# Patient Record
Sex: Female | Born: 1960
Health system: Southern US, Community
[De-identification: ages and names within clinical notes are randomized; demographics above are authoritative.]

## PROBLEM LIST (undated history)

## (undated) DIAGNOSIS — M2550 Pain in unspecified joint: Secondary | ICD-10-CM

## (undated) DIAGNOSIS — R569 Unspecified convulsions: Secondary | ICD-10-CM

## (undated) DIAGNOSIS — R351 Nocturia: Secondary | ICD-10-CM

## (undated) DIAGNOSIS — T7840XA Allergy, unspecified, initial encounter: Secondary | ICD-10-CM

## (undated) DIAGNOSIS — Z8709 Personal history of other diseases of the respiratory system: Secondary | ICD-10-CM

## (undated) DIAGNOSIS — F419 Anxiety disorder, unspecified: Secondary | ICD-10-CM

## (undated) DIAGNOSIS — R531 Weakness: Secondary | ICD-10-CM

## (undated) DIAGNOSIS — R3915 Urgency of urination: Secondary | ICD-10-CM

## (undated) DIAGNOSIS — J302 Other seasonal allergic rhinitis: Secondary | ICD-10-CM

## (undated) DIAGNOSIS — M62830 Muscle spasm of back: Secondary | ICD-10-CM

## (undated) DIAGNOSIS — D649 Anemia, unspecified: Secondary | ICD-10-CM

## (undated) DIAGNOSIS — K219 Gastro-esophageal reflux disease without esophagitis: Secondary | ICD-10-CM

## (undated) DIAGNOSIS — G8929 Other chronic pain: Secondary | ICD-10-CM

## (undated) DIAGNOSIS — G4700 Insomnia, unspecified: Secondary | ICD-10-CM

## (undated) DIAGNOSIS — E785 Hyperlipidemia, unspecified: Secondary | ICD-10-CM

## (undated) DIAGNOSIS — R350 Frequency of micturition: Secondary | ICD-10-CM

## (undated) HISTORY — PX: ABDOMINAL HYSTERECTOMY: SHX81

## (undated) HISTORY — PX: TONSILLECTOMY: SUR1361

## (undated) HISTORY — DX: Allergy, unspecified, initial encounter: T78.40XA

## (undated) HISTORY — DX: Gastro-esophageal reflux disease without esophagitis: K21.9

---

## 1999-12-09 ENCOUNTER — Emergency Department (HOSPITAL_COMMUNITY): Admission: EM | Admit: 1999-12-09 | Discharge: 1999-12-10 | Payer: Self-pay | Admitting: Internal Medicine

## 2004-09-11 ENCOUNTER — Emergency Department (HOSPITAL_COMMUNITY): Admission: EM | Admit: 2004-09-11 | Discharge: 2004-09-12 | Payer: Self-pay | Admitting: Emergency Medicine

## 2004-09-20 ENCOUNTER — Emergency Department (HOSPITAL_COMMUNITY): Admission: EM | Admit: 2004-09-20 | Discharge: 2004-09-20 | Payer: Self-pay | Admitting: Emergency Medicine

## 2005-02-09 ENCOUNTER — Emergency Department (HOSPITAL_COMMUNITY): Admission: EM | Admit: 2005-02-09 | Discharge: 2005-02-09 | Payer: Self-pay | Admitting: Emergency Medicine

## 2005-07-28 ENCOUNTER — Ambulatory Visit: Payer: Self-pay | Admitting: Family Medicine

## 2005-07-29 ENCOUNTER — Ambulatory Visit: Payer: Self-pay | Admitting: *Deleted

## 2005-08-13 ENCOUNTER — Encounter: Payer: Self-pay | Admitting: Cardiology

## 2005-08-13 ENCOUNTER — Ambulatory Visit: Payer: Self-pay | Admitting: Cardiology

## 2005-08-13 ENCOUNTER — Ambulatory Visit (HOSPITAL_COMMUNITY): Admission: RE | Admit: 2005-08-13 | Discharge: 2005-08-13 | Payer: Self-pay | Admitting: Internal Medicine

## 2005-08-26 ENCOUNTER — Ambulatory Visit: Payer: Self-pay | Admitting: Family Medicine

## 2005-09-09 ENCOUNTER — Ambulatory Visit: Payer: Self-pay | Admitting: Family Medicine

## 2006-01-06 ENCOUNTER — Ambulatory Visit: Payer: Self-pay | Admitting: Family Medicine

## 2006-12-16 ENCOUNTER — Encounter (INDEPENDENT_AMBULATORY_CARE_PROVIDER_SITE_OTHER): Payer: Self-pay | Admitting: *Deleted

## 2007-04-02 ENCOUNTER — Encounter: Admission: RE | Admit: 2007-04-02 | Discharge: 2007-04-02 | Payer: Self-pay | Admitting: *Deleted

## 2007-10-19 ENCOUNTER — Ambulatory Visit (HOSPITAL_COMMUNITY): Admission: RE | Admit: 2007-10-19 | Discharge: 2007-10-20 | Payer: Self-pay | Admitting: Obstetrics & Gynecology

## 2007-10-19 ENCOUNTER — Encounter (INDEPENDENT_AMBULATORY_CARE_PROVIDER_SITE_OTHER): Payer: Self-pay | Admitting: Obstetrics & Gynecology

## 2008-04-18 ENCOUNTER — Encounter: Admission: RE | Admit: 2008-04-18 | Discharge: 2008-04-18 | Payer: Self-pay | Admitting: Obstetrics & Gynecology

## 2008-07-20 ENCOUNTER — Encounter: Admission: RE | Admit: 2008-07-20 | Discharge: 2008-07-20 | Payer: Self-pay | Admitting: Family Medicine

## 2010-04-30 ENCOUNTER — Other Ambulatory Visit: Payer: Self-pay | Admitting: Family Medicine

## 2010-04-30 DIAGNOSIS — Z1239 Encounter for other screening for malignant neoplasm of breast: Secondary | ICD-10-CM

## 2010-04-30 DIAGNOSIS — Z90710 Acquired absence of both cervix and uterus: Secondary | ICD-10-CM

## 2010-07-22 ENCOUNTER — Inpatient Hospital Stay: Admission: RE | Admit: 2010-07-22 | Payer: Self-pay | Source: Ambulatory Visit

## 2010-07-22 ENCOUNTER — Ambulatory Visit: Payer: Self-pay

## 2010-08-13 NOTE — Op Note (Signed)
NAME:  Gina Velez, Gina Velez               ACCOUNT NO.:  [id number]   MEDICAL RECORD NO.:  [id number]          PATIENT TYPE:  AMB   LOCATION:  DAY                          FACILITY:  [facility]   PHYSICIAN:  Genia Del, M.D.DATE OF BIRTH:  [birth year]   DATE OF PROCEDURE:  10/19/2007  DATE OF DISCHARGE:                               OPERATIVE REPORT   PREOPERATIVE DIAGNOSIS:  Large symptomatic uterine myomas with  menorrhagia and secondary anemia.   POSTOPERATIVE DIAGNOSIS:  Large symptomatic uterine myomas with  menorrhagia and secondary anemia.   PROCEDURE:  Total laparoscopic hysterectomy assisted with Engineer, building services  robot.   SURGEON:  Dr. Genia Del.   ASSISTANT:  Dr. Meredeth Ide.   PROCEDURE:  Under general anesthesia with endotracheal intubation, the  patient is in decubitus position.  She is prepped with Betadine on the  abdominal, suprapubic, vulvar and vaginal areas and draped as usual.  A  Foley is put in place in the bladder.  We then do a vaginal exam  revealing an anteverted uterus just below the umbilicus, mobile and  irregular, it measures about 16 cm.  We then introduce a weighted  speculum.  We grasp the cervix with a tenaculum.  We proceed with  dilatation of the cervix up to Hegar dilator #27.  We then used a #10  Rumi with a medium-sized KOH ring.  This is inserted easily.  The  balloon is inflated.  We remove the weighted speculum.  We go to  abdominal time.  We measure 10 cm above the fundus for the  supraumbilical incision.  We infiltrate the thinnest tissue with  Marcaine 25% plain 10 mL at that level.  We then make an incision over  1.5 cm.  We grasp the aponeurosis with Kochers, open the aponeurosis  with Mayo scissors and open the parietal peritoneum bluntly with a  finger.  We make a pursestring stitch of Vicryl 0 at that level.  We  then insert the [facility], create a pneumoperitoneum with CO2, attach the  suture on either side of the [place] and insert  the camera at that level.  The abdominopelvic cavities are normal except for this very large  irregular uterus.  It is mobile and we have enough base on each side to  proceed with robot.  We therefore mark all other ports.  We infiltrate  the subcutaneous tissue with Marcaine 25% plain, a total of 9 mL.  We  make small incision with the scalpel in a semicircular configuration, 8  mm for the robotic ports and 10 mm for the assistant port on the right.  We then insert the trocars under direct vision.  We then placed the  patient in extreme Trendelenburg and side docked the robot on the left  side of the patient.  We then insert the instruments, the Endo shear  scissors in the right robotic arm, the fenestrated bipolar in the left  robotic arm and the Cobra clamp in the third robotic arm on the left.  We start at the console.  We cauterize and section the left round  ligament.  We cauterize and section the left tube and the left utero-  ovarian ligament.  We visualize the left ureter which is very deep in  normal anatomic position.  We then follow over the left lateral side of  the uterus at the broad ligament.  We cauterize and section.  We open  the anterior visceral peritoneum with the Endo shear scissors up to the  midline on that side.  We then go to the right side and proceed exactly  the same way.  We recline the bladder downward pass the KOH ring.  We  then cauterize and section the right uterine artery just above the KOH  ring.  We proceed the same way on the left side.  We then further  recline the bladder and proceed with anterior colpotomy with the point  of the Endo shear scissors.  We then do the colpotomy posteriorly just  above the uterosacral ligaments and we complete the circular opening at  the colpotomy on the left side and then on the right side.  The uterus  is completely detached the KOH ring and Rumi are therefore removed.  The  occluder is put in place in the vagina.   The uterus with cervix is put  in the left gutter during the closure of the vaginal vault.  We change  instruments to the cutting needle driver in the right arm, the regular  needle driver in the left arm and the fenestrated bipolar in the third  robotic arm.  We used Vicryl 0 with a Laparotie at the end and close the  vaginal vault across the right angle to the middle.  We proceed the same  way closing the vaginal vault from the left angle to the middle.  The  vaginal vault is well closed.  Hemostasis is adequate at all levels  including at the pedicles of the adnexa.  We therefore remove the  robotic instruments.  We undock the robot and we go to laparoscopy time.  We insert the morcellator in the assistant port under direct vision.  We  then proceed with morcellation of the uterus with cervix.  The specimen  is weighed.  It is a 682 grams.  The abdominopelvic cavities are  irrigated and suctioned.  All pieces of myomas and uterus were well and  carefully removed.  Hemostasis is adequate at all levels.  The  instruments are therefore removed.  The trocars are removed under direct  vision.  The CO2 is evacuated as completely as possible.  We close the  right assistant port with a Vicryl 0 at the aponeurosis.  We attached  the pursestring stitch at the supraumbilical incision.  We then close  the assistant port incision and the umbilical incision with Vicryl 4-0  at the skin in a subcuticular stitch.  We then apply Dermabond at all  incisions.  Hemostasis is adequate at all levels.  The occluder is  removed from the vagina.  Hemostasis is adequate at that level as well.   ESTIMATED BLOOD LOSS:  100 mL.   The count of instruments and sponges was complete.   The patient was brought to recovery room in good stable status.      Genia Del, M.D.  Electronically Signed     ML/MEDQ  D:  10/19/2007  T:  10/19/2007  Job:  [id number]

## 2010-12-27 LAB — URINALYSIS, ROUTINE W REFLEX MICROSCOPIC
Bilirubin Urine: NEGATIVE
Ketones, ur: NEGATIVE
Nitrite: NEGATIVE
Protein, ur: NEGATIVE
Urobilinogen, UA: 0.2
pH: 6

## 2010-12-27 LAB — CBC
HCT: 28.1 — ABNORMAL LOW
Hemoglobin: 9.1 — ABNORMAL LOW
MCHC: 32.2
MCHC: 32.9
MCV: 81.9
MCV: 82.2
Platelets: 342
RBC: 3.43 — ABNORMAL LOW
RBC: 3.79 — ABNORMAL LOW
RDW: 15.9 — ABNORMAL HIGH
WBC: 4.3

## 2010-12-27 LAB — PREGNANCY, URINE: Preg Test, Ur: NEGATIVE

## 2010-12-27 LAB — TYPE AND SCREEN

## 2012-03-27 ENCOUNTER — Emergency Department (HOSPITAL_COMMUNITY)
Admission: EM | Admit: 2012-03-27 | Discharge: 2012-03-27 | Disposition: A | Discharge status: Home / Self Care | Payer: No Typology Code available for payment source | Attending: Emergency Medicine | Admitting: Emergency Medicine

## 2012-03-27 ENCOUNTER — Emergency Department (HOSPITAL_COMMUNITY): Payer: No Typology Code available for payment source

## 2012-03-27 ENCOUNTER — Encounter (HOSPITAL_COMMUNITY): Payer: Self-pay | Admitting: Emergency Medicine

## 2012-03-27 DIAGNOSIS — Y92410 Unspecified street and highway as the place of occurrence of the external cause: Secondary | ICD-10-CM | POA: Insufficient documentation

## 2012-03-27 DIAGNOSIS — S161XXA Strain of muscle, fascia and tendon at neck level, initial encounter: Secondary | ICD-10-CM

## 2012-03-27 DIAGNOSIS — Y9389 Activity, other specified: Secondary | ICD-10-CM | POA: Insufficient documentation

## 2012-03-27 DIAGNOSIS — S20219A Contusion of unspecified front wall of thorax, initial encounter: Secondary | ICD-10-CM | POA: Insufficient documentation

## 2012-03-27 DIAGNOSIS — S139XXA Sprain of joints and ligaments of unspecified parts of neck, initial encounter: Secondary | ICD-10-CM | POA: Insufficient documentation

## 2012-03-27 MED ORDER — OXYCODONE-ACETAMINOPHEN 5-325 MG PO TABS: 1.0000 | ORAL_TABLET | Freq: Four times a day (QID) | ORAL | Status: DC | PRN

## 2012-03-27 MED ORDER — IBUPROFEN 600 MG PO TABS: 600.0000 mg | ORAL_TABLET | Freq: Four times a day (QID) | ORAL | Status: DC | PRN

## 2012-03-27 MED ORDER — DIAZEPAM 5 MG PO TABS: 5.0000 mg | ORAL_TABLET | Freq: Four times a day (QID) | ORAL | Status: DC | PRN

## 2012-03-27 NOTE — ED Notes (Signed)
Pt ambulatory leaving ED with family; pt given d/c teaching and prescriptions; pt has no further questions upon d/c. Pt does not appear to be in any acute distress upon d/c.

## 2012-03-27 NOTE — ED Notes (Signed)
Pt denies nausea; pt denies blurred vision; pt denies numbness and tingling; pt alert and oriented.
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
## 2012-03-27 NOTE — ED Notes (Signed)
Pt returned from CT °
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
## 2012-09-15 ENCOUNTER — Other Ambulatory Visit: Payer: Self-pay

## 2012-09-15 DIAGNOSIS — Z1231 Encounter for screening mammogram for malignant neoplasm of breast: Secondary | ICD-10-CM

## 2012-09-24 ENCOUNTER — Ambulatory Visit
Admission: RE | Admit: 2012-09-24 | Discharge: 2012-09-24 | Disposition: A | Discharge status: Home / Self Care | Payer: Private Health Insurance - Indemnity | Source: Ambulatory Visit

## 2012-09-24 DIAGNOSIS — Z1231 Encounter for screening mammogram for malignant neoplasm of breast: Secondary | ICD-10-CM

## 2013-04-29 ENCOUNTER — Other Ambulatory Visit: Payer: Self-pay | Admitting: Orthopedic Surgery

## 2013-04-29 ENCOUNTER — Encounter (HOSPITAL_COMMUNITY): Payer: Self-pay | Admitting: Pharmacy Technician

## 2013-05-05 ENCOUNTER — Encounter (HOSPITAL_COMMUNITY)
Admission: RE | Admit: 2013-05-05 | Discharge: 2013-05-05 | Disposition: A | Discharge status: Home / Self Care | Payer: Managed Care, Other (non HMO) | Source: Ambulatory Visit | Attending: Orthopedic Surgery | Admitting: Orthopedic Surgery

## 2013-05-05 ENCOUNTER — Encounter (HOSPITAL_COMMUNITY): Payer: Self-pay

## 2013-05-05 ENCOUNTER — Ambulatory Visit (HOSPITAL_COMMUNITY)
Admission: RE | Admit: 2013-05-05 | Discharge: 2013-05-05 | Disposition: A | Discharge status: Home / Self Care | Payer: Managed Care, Other (non HMO) | Source: Ambulatory Visit | Attending: Orthopedic Surgery | Admitting: Orthopedic Surgery

## 2013-05-05 DIAGNOSIS — Z01810 Encounter for preprocedural cardiovascular examination: Secondary | ICD-10-CM | POA: Insufficient documentation

## 2013-05-05 DIAGNOSIS — M533 Sacrococcygeal disorders, not elsewhere classified: Secondary | ICD-10-CM | POA: Insufficient documentation

## 2013-05-05 DIAGNOSIS — E785 Hyperlipidemia, unspecified: Secondary | ICD-10-CM | POA: Insufficient documentation

## 2013-05-05 DIAGNOSIS — R9431 Abnormal electrocardiogram [ECG] [EKG]: Secondary | ICD-10-CM | POA: Insufficient documentation

## 2013-05-05 DIAGNOSIS — D649 Anemia, unspecified: Secondary | ICD-10-CM | POA: Insufficient documentation

## 2013-05-05 DIAGNOSIS — Z90710 Acquired absence of both cervix and uterus: Secondary | ICD-10-CM | POA: Insufficient documentation

## 2013-05-05 DIAGNOSIS — Z01812 Encounter for preprocedural laboratory examination: Secondary | ICD-10-CM | POA: Insufficient documentation

## 2013-05-05 DIAGNOSIS — Z87891 Personal history of nicotine dependence: Secondary | ICD-10-CM | POA: Insufficient documentation

## 2013-05-05 HISTORY — DX: Hyperlipidemia, unspecified: E78.5

## 2013-05-05 HISTORY — DX: Other seasonal allergic rhinitis: J30.2

## 2013-05-05 HISTORY — DX: Anemia, unspecified: D64.9

## 2013-05-05 HISTORY — DX: Unspecified convulsions: R56.9

## 2013-05-05 HISTORY — DX: Other chronic pain: G89.29

## 2013-05-05 HISTORY — DX: Personal history of other diseases of the respiratory system: Z87.09

## 2013-05-05 HISTORY — DX: Urgency of urination: R39.15

## 2013-05-05 HISTORY — DX: Muscle spasm of back: M62.830

## 2013-05-05 HISTORY — DX: Pain in unspecified joint: M25.50

## 2013-05-05 HISTORY — DX: Nocturia: R35.1

## 2013-05-05 HISTORY — DX: Weakness: R53.1

## 2013-05-05 HISTORY — DX: Frequency of micturition: R35.0

## 2013-05-05 HISTORY — DX: Insomnia, unspecified: G47.00

## 2013-05-05 HISTORY — DX: Anxiety disorder, unspecified: F41.9

## 2013-05-05 LAB — CBC WITH DIFFERENTIAL/PLATELET
Basophils Absolute: 0 10*3/uL (ref 0.0–0.1)
Basophils Relative: 0 % (ref 0–1)
Eosinophils Absolute: 0.1 10*3/uL (ref 0.0–0.7)
Eosinophils Relative: 1 % (ref 0–5)
HEMATOCRIT: 37.8 % (ref 36.0–46.0)
HEMOGLOBIN: 12.7 g/dL (ref 12.0–15.0)
LYMPHS ABS: 2.5 10*3/uL (ref 0.7–4.0)
Lymphocytes Relative: 41 % (ref 12–46)
MCH: 29.3 pg (ref 26.0–34.0)
MCHC: 33.6 g/dL (ref 30.0–36.0)
MCV: 87.3 fL (ref 78.0–100.0)
MONO ABS: 0.4 10*3/uL (ref 0.1–1.0)
Monocytes Relative: 7 % (ref 3–12)
NEUTROS ABS: 3.1 10*3/uL (ref 1.7–7.7)
Neutrophils Relative %: 51 % (ref 43–77)
Platelets: 268 10*3/uL (ref 150–400)
RBC: 4.33 MIL/uL (ref 3.87–5.11)
RDW: 13.8 % (ref 11.5–15.5)
WBC: 6.1 10*3/uL (ref 4.0–10.5)

## 2013-05-05 LAB — COMPREHENSIVE METABOLIC PANEL
ALT: 19 U/L (ref 0–35)
AST: 18 U/L (ref 0–37)
Albumin: 4.2 g/dL (ref 3.5–5.2)
Alkaline Phosphatase: 72 U/L (ref 39–117)
BUN: 5 mg/dL — AB (ref 6–23)
CALCIUM: 9.3 mg/dL (ref 8.4–10.5)
CHLORIDE: 98 meq/L (ref 96–112)
CO2: 27 mEq/L (ref 19–32)
CREATININE: 0.7 mg/dL (ref 0.50–1.10)
GFR calc non Af Amer: >90 mL/min (ref >90)
GLUCOSE: 103 mg/dL — AB (ref 70–99)
Potassium: 4 mEq/L (ref 3.7–5.3)
Sodium: 139 mEq/L (ref 137–147)
Total Bilirubin: 0.3 mg/dL (ref 0.3–1.2)
Total Protein: 7.8 g/dL (ref 6.0–8.3)

## 2013-05-05 LAB — TYPE AND SCREEN
ABO/RH(D): A POS
Antibody Screen: NEGATIVE

## 2013-05-05 LAB — URINALYSIS, ROUTINE W REFLEX MICROSCOPIC
Bilirubin Urine: NEGATIVE
GLUCOSE, UA: NEGATIVE mg/dL
Hgb urine dipstick: NEGATIVE
KETONES UR: NEGATIVE mg/dL
Leukocytes, UA: NEGATIVE
Nitrite: NEGATIVE
PROTEIN: NEGATIVE mg/dL
Specific Gravity, Urine: 1.01 (ref 1.005–1.030)
Urobilinogen, UA: 0.2 mg/dL (ref 0.0–1.0)
pH: 7 (ref 5.0–8.0)

## 2013-05-05 LAB — PROTIME-INR
INR: 0.96 (ref 0.00–1.49)
Prothrombin Time: 12.6 seconds (ref 11.6–15.2)

## 2013-05-05 LAB — SURGICAL PCR SCREEN
MRSA, PCR: NEGATIVE
Staphylococcus aureus: NEGATIVE

## 2013-05-05 LAB — ABO/RH: ABO/RH(D): A POS

## 2013-05-05 LAB — APTT: aPTT: 29 seconds (ref 24–37)

## 2013-05-05 MED ORDER — POVIDONE-IODINE 7.5 % EX SOLN: Freq: Once | CUTANEOUS | Status: DC

## 2013-05-05 NOTE — Pre-Procedure Instructions (Signed)
Gina SalinesCynthia K Velez  05/05/2013   Your procedure is scheduled on:  Wed, Feb 11 @ 2:50 PM  [facility]  at 10:00 AM per Dr.Dumonski  Call this number if you have problems the morning of surgery: [phone]   Remember:   Do not eat food or drink liquids after midnight.   Take these medicines the morning of surgery with A SIP OF WATER: Lexapro(Escitalopram), and Pain Pill(if needed)               No Goody's,BC's,Aspirin,Aleve,Ibuprofen,Fish Oil,or any Herbal Medications   Do not wear jewelry, make-up or nail polish.  Do not wear lotions, powders, or perfumes. You may wear deodorant.  Do not shave 48 hours prior to surgery. Men may shave face and neck.  Do not bring valuables to the hospital.  [facility] is not responsible                  for any belongings or valuables.               Contacts, dentures or bridgework may not be worn into surgery.  Leave suitcase in the car. After surgery it may be brought to your room.  For patients admitted to the hospital, discharge time is determined by your                treatment team.               Patients discharged the day of surgery will not be allowed to drive  home.    Special Instructions:  [facility] - Preparing for Surgery  Before surgery, you can play an important role.  Because skin is not sterile, your skin needs to be as free of germs as possible.  You can reduce the number of germs on you skin by washing with CHG (chlorahexidine gluconate) soap before surgery.  CHG is an antiseptic cleaner which kills germs and bonds with the skin to continue killing germs even after washing.  Please DO NOT use if you have an allergy to CHG or antibacterial soaps.  If your skin becomes reddened/irritated stop using the CHG and inform your nurse when you arrive at Short Stay.  Do not shave (including legs and underarms) for at least 48 hours prior to the first CHG shower.  You may shave your face.  Please follow these  instructions carefully:   1.  Shower with CHG Soap the night before surgery and the                                morning of Surgery.  2.  If you choose to wash your hair, wash your hair first as usual with your       normal shampoo.  3.  After you shampoo, rinse your hair and body thoroughly to remove the                      Shampoo.  4.  Use CHG as you would any other liquid soap.  You can apply chg directly       to the skin and wash gently with scrungie or a clean washcloth.  5.  Apply the CHG Soap to your body ONLY FROM THE NECK DOWN.        Do not use on open wounds or open sores.  Avoid contact with your eyes,  ears, mouth and genitals (private parts).  Wash genitals (private parts)       with your normal soap.  6.  Wash thoroughly, paying special attention to the area where your surgery        will be performed.  7.  Thoroughly rinse your body with warm water from the neck down.  8.  DO NOT shower/wash with your normal soap after using and rinsing off       the CHG Soap.  9.  Pat yourself dry with a clean towel.            10.  Wear clean pajamas.            11.  Place clean sheets on your bed the night of your first shower and do not        sleep with pets.  Day of Surgery  Do not apply any lotions/deoderants the morning of surgery.  Please wear clean clothes to the hospital/surgery center.     Please read over the following fact sheets that you were given: Pain Booklet, Coughing and Deep Breathing, Blood Transfusion Information, MRSA Information and Surgical Site Infection Prevention

## 2013-05-05 NOTE — Progress Notes (Signed)
Pt doesn't have a cardiologist  Echo report in epic from 2007  Denies ever having a stress test or a heart cath  Medical md is Dr. Maryelizabeth RowanElizabeth Dewey  Denies EKG or CXR in past yr

## 2013-05-06 NOTE — Progress Notes (Signed)
Anesthesia Chart Review:  Patient is a 53 year old female scheduled for right SI joint fusion on 05/11/13 by Dr. Yevette Edwardsumonski.  History includes former smoker, HLD, anemia, anxiety, insomnia, tonsillectomy, hysterectomy, seizure related to pregnancy 30+ years ago. PCP is Dr. Maryelizabeth RowanElizabeth Dewey.  EKG on 05/05/13 showed NSR, non-specific ST/T wave changes. Echo in 2007 showed excellent LV function and no significant valvular abnormalities.  Preoperative CXR and labs noted.    Anticipate that she can proceed as planned.  Gina Ochsllison Rana Hochstein, PA-C [facility]/Anesthesiology Phone [phone] 05/06/2013 3:22 PM

## 2013-05-10 MED ORDER — CEFAZOLIN SODIUM-DEXTROSE 2-3 GM-% IV SOLR: 2.0000 g | INTRAVENOUS | Status: DC

## 2013-05-11 ENCOUNTER — Encounter (HOSPITAL_COMMUNITY): Payer: Self-pay | Admitting: *Deleted

## 2013-05-11 ENCOUNTER — Ambulatory Visit (HOSPITAL_COMMUNITY): Payer: Managed Care, Other (non HMO)

## 2013-05-11 ENCOUNTER — Ambulatory Visit (HOSPITAL_COMMUNITY)
Admission: RE | Admit: 2013-05-11 | Discharge: 2013-05-11 | Disposition: A | Discharge status: Home / Self Care | Payer: Managed Care, Other (non HMO) | Source: Ambulatory Visit | Attending: Orthopedic Surgery | Admitting: Orthopedic Surgery

## 2013-05-11 ENCOUNTER — Encounter (HOSPITAL_COMMUNITY)
Admission: RE | Disposition: A | Discharge status: Home / Self Care | Payer: Self-pay | Source: Ambulatory Visit | Attending: Orthopedic Surgery

## 2013-05-11 ENCOUNTER — Encounter (HOSPITAL_COMMUNITY): Payer: Managed Care, Other (non HMO) | Admitting: Vascular Surgery

## 2013-05-11 ENCOUNTER — Ambulatory Visit (HOSPITAL_COMMUNITY): Payer: Managed Care, Other (non HMO) | Admitting: Anesthesiology

## 2013-05-11 DIAGNOSIS — J449 Chronic obstructive pulmonary disease, unspecified: Secondary | ICD-10-CM | POA: Diagnosis not present

## 2013-05-11 DIAGNOSIS — R3915 Urgency of urination: Secondary | ICD-10-CM | POA: Diagnosis not present

## 2013-05-11 DIAGNOSIS — R5383 Other fatigue: Secondary | ICD-10-CM | POA: Diagnosis not present

## 2013-05-11 DIAGNOSIS — N3944 Nocturnal enuresis: Secondary | ICD-10-CM | POA: Diagnosis not present

## 2013-05-11 DIAGNOSIS — R350 Frequency of micturition: Secondary | ICD-10-CM | POA: Insufficient documentation

## 2013-05-11 DIAGNOSIS — J4489 Other specified chronic obstructive pulmonary disease: Secondary | ICD-10-CM | POA: Insufficient documentation

## 2013-05-11 DIAGNOSIS — G4700 Insomnia, unspecified: Secondary | ICD-10-CM | POA: Diagnosis not present

## 2013-05-11 DIAGNOSIS — R5381 Other malaise: Secondary | ICD-10-CM | POA: Diagnosis not present

## 2013-05-11 DIAGNOSIS — Z87891 Personal history of nicotine dependence: Secondary | ICD-10-CM | POA: Diagnosis not present

## 2013-05-11 DIAGNOSIS — M533 Sacrococcygeal disorders, not elsewhere classified: Secondary | ICD-10-CM

## 2013-05-11 DIAGNOSIS — F411 Generalized anxiety disorder: Secondary | ICD-10-CM | POA: Diagnosis not present

## 2013-05-11 DIAGNOSIS — E785 Hyperlipidemia, unspecified: Secondary | ICD-10-CM | POA: Diagnosis not present

## 2013-05-11 DIAGNOSIS — G8929 Other chronic pain: Secondary | ICD-10-CM | POA: Diagnosis not present

## 2013-05-11 HISTORY — PX: SACROILIAC JOINT FUSION: SHX6088

## 2013-05-11 SURGERY — SACROILIAC JOINT FUSION
Anesthesia: General | Site: Spine Lumbar | Laterality: Right

## 2013-05-11 MED ORDER — MIDAZOLAM HCL 5 MG/5ML IJ SOLN
INTRAMUSCULAR | Status: DC | PRN
  Administered 2013-05-11: 2 mg via INTRAVENOUS

## 2013-05-11 MED ORDER — ARTIFICIAL TEARS OP OINT
TOPICAL_OINTMENT | OPHTHALMIC | Status: AC
  Filled 2013-05-11: qty 3.5

## 2013-05-11 MED ORDER — FENTANYL CITRATE 0.05 MG/ML IJ SOLN
INTRAMUSCULAR | Status: AC
  Filled 2013-05-11: qty 5

## 2013-05-11 MED ORDER — MIDAZOLAM HCL 2 MG/2ML IJ SOLN: 0.5000 mg | Freq: Once | INTRAMUSCULAR | Status: DC | PRN

## 2013-05-11 MED ORDER — ARTIFICIAL TEARS OP OINT
TOPICAL_OINTMENT | OPHTHALMIC | Status: DC | PRN
  Administered 2013-05-11: 1 via OPHTHALMIC

## 2013-05-11 MED ORDER — ONDANSETRON HCL 4 MG/2ML IJ SOLN
INTRAMUSCULAR | Status: DC | PRN
  Administered 2013-05-11: 4 mg via INTRAVENOUS

## 2013-05-11 MED ORDER — HYDROMORPHONE HCL PF 1 MG/ML IJ SOLN
INTRAMUSCULAR | Status: AC
  Administered 2013-05-11: 0.5 mg via INTRAVENOUS
  Filled 2013-05-11: qty 1

## 2013-05-11 MED ORDER — OXYCODONE HCL 5 MG/5ML PO SOLN: 5.0000 mg | Freq: Once | ORAL | Status: DC | PRN

## 2013-05-11 MED ORDER — EPHEDRINE SULFATE 50 MG/ML IJ SOLN
INTRAMUSCULAR | Status: AC
  Filled 2013-05-11: qty 1

## 2013-05-11 MED ORDER — SODIUM CHLORIDE 0.9 % IJ SOLN
INTRAMUSCULAR | Status: AC
  Filled 2013-05-11: qty 20

## 2013-05-11 MED ORDER — EPHEDRINE SULFATE 50 MG/ML IJ SOLN
INTRAMUSCULAR | Status: DC | PRN
  Administered 2013-05-11: 7.5 mg via INTRAVENOUS
  Administered 2013-05-11 (×2): 5 mg via INTRAVENOUS

## 2013-05-11 MED ORDER — HYDROMORPHONE HCL PF 1 MG/ML IJ SOLN
0.2500 mg | INTRAMUSCULAR | Status: DC | PRN
  Administered 2013-05-11 (×4): 0.5 mg via INTRAVENOUS

## 2013-05-11 MED ORDER — LACTATED RINGERS IV SOLN
INTRAVENOUS | Status: DC
  Administered 2013-05-11: 11:00:00 via INTRAVENOUS

## 2013-05-11 MED ORDER — PROPOFOL 10 MG/ML IV BOLUS
INTRAVENOUS | Status: AC
  Filled 2013-05-11: qty 20

## 2013-05-11 MED ORDER — ROCURONIUM BROMIDE 100 MG/10ML IV SOLN
INTRAVENOUS | Status: DC | PRN
  Administered 2013-05-11: 50 mg via INTRAVENOUS

## 2013-05-11 MED ORDER — GLYCOPYRROLATE 0.2 MG/ML IJ SOLN
INTRAMUSCULAR | Status: AC
  Filled 2013-05-11: qty 2

## 2013-05-11 MED ORDER — PROPOFOL 10 MG/ML IV BOLUS
INTRAVENOUS | Status: DC | PRN
  Administered 2013-05-11: 130 mg via INTRAVENOUS

## 2013-05-11 MED ORDER — HYDROMORPHONE HCL PF 1 MG/ML IJ SOLN
0.5000 mg | INTRAMUSCULAR | Status: AC | PRN
  Administered 2013-05-11 (×2): 0.5 mg via INTRAVENOUS

## 2013-05-11 MED ORDER — 0.9 % SODIUM CHLORIDE (POUR BTL) OPTIME
TOPICAL | Status: DC | PRN
  Administered 2013-05-11: 1000 mL

## 2013-05-11 MED ORDER — ROCURONIUM BROMIDE 50 MG/5ML IV SOLN
INTRAVENOUS | Status: AC
  Filled 2013-05-11: qty 1

## 2013-05-11 MED ORDER — GLYCOPYRROLATE 0.2 MG/ML IJ SOLN
INTRAMUSCULAR | Status: AC
  Filled 2013-05-11: qty 3

## 2013-05-11 MED ORDER — GLYCOPYRROLATE 0.2 MG/ML IJ SOLN
INTRAMUSCULAR | Status: DC | PRN
  Administered 2013-05-11: 0.4 mg via INTRAVENOUS

## 2013-05-11 MED ORDER — BUPIVACAINE-EPINEPHRINE PF 0.25-1:200000 % IJ SOLN
INTRAMUSCULAR | Status: DC | PRN
  Administered 2013-05-11: 3 mL via PERINEURAL
  Administered 2013-05-11: 10 mL via PERINEURAL

## 2013-05-11 MED ORDER — NEOSTIGMINE METHYLSULFATE 1 MG/ML IJ SOLN
INTRAMUSCULAR | Status: AC
  Filled 2013-05-11: qty 10

## 2013-05-11 MED ORDER — PROMETHAZINE HCL 25 MG/ML IJ SOLN: 6.2500 mg | INTRAMUSCULAR | Status: DC | PRN

## 2013-05-11 MED ORDER — SUCCINYLCHOLINE CHLORIDE 20 MG/ML IJ SOLN
INTRAMUSCULAR | Status: AC
  Filled 2013-05-11: qty 1

## 2013-05-11 MED ORDER — DIAZEPAM 5 MG PO TABS
ORAL_TABLET | ORAL | Status: AC
  Administered 2013-05-11: 5 mg via ORAL
  Filled 2013-05-11: qty 1

## 2013-05-11 MED ORDER — FENTANYL CITRATE 0.05 MG/ML IJ SOLN
INTRAMUSCULAR | Status: DC | PRN
  Administered 2013-05-11: 250 ug via INTRAVENOUS

## 2013-05-11 MED ORDER — MIDAZOLAM HCL 2 MG/2ML IJ SOLN
INTRAMUSCULAR | Status: AC
  Filled 2013-05-11: qty 2

## 2013-05-11 MED ORDER — ONDANSETRON HCL 4 MG/2ML IJ SOLN
INTRAMUSCULAR | Status: AC
  Filled 2013-05-11: qty 2

## 2013-05-11 MED ORDER — NEOSTIGMINE METHYLSULFATE 1 MG/ML IJ SOLN
INTRAMUSCULAR | Status: DC | PRN
  Administered 2013-05-11: 3 mg via INTRAVENOUS

## 2013-05-11 MED ORDER — LIDOCAINE HCL (CARDIAC) 20 MG/ML IV SOLN
INTRAVENOUS | Status: DC | PRN
  Administered 2013-05-11: 50 mg via INTRAVENOUS

## 2013-05-11 MED ORDER — CEFAZOLIN SODIUM-DEXTROSE 2-3 GM-% IV SOLR
INTRAVENOUS | Status: DC | PRN
  Administered 2013-05-11: 2 g via INTRAVENOUS

## 2013-05-11 MED ORDER — OXYCODONE-ACETAMINOPHEN 5-325 MG PO TABS
ORAL_TABLET | ORAL | Status: AC
  Filled 2013-05-11: qty 1

## 2013-05-11 MED ORDER — BUPIVACAINE-EPINEPHRINE (PF) 0.25% -1:200000 IJ SOLN
INTRAMUSCULAR | Status: AC
  Filled 2013-05-11: qty 30

## 2013-05-11 MED ORDER — OXYCODONE HCL 5 MG PO TABS
ORAL_TABLET | ORAL | Status: AC
  Filled 2013-05-11: qty 1

## 2013-05-11 MED ORDER — LACTATED RINGERS IV SOLN
INTRAVENOUS | Status: DC | PRN
  Administered 2013-05-11 (×2): via INTRAVENOUS

## 2013-05-11 MED ORDER — OXYCODONE-ACETAMINOPHEN 5-325 MG PO TABS
1.0000 | ORAL_TABLET | ORAL | Status: DC | PRN
  Administered 2013-05-11: 1 via ORAL

## 2013-05-11 MED ORDER — DIAZEPAM 5 MG PO TABS
5.0000 mg | ORAL_TABLET | Freq: Four times a day (QID) | ORAL | Status: DC | PRN
  Administered 2013-05-11: 5 mg via ORAL

## 2013-05-11 MED ORDER — MEPERIDINE HCL 25 MG/ML IJ SOLN: 6.2500 mg | INTRAMUSCULAR | Status: DC | PRN

## 2013-05-11 MED ORDER — OXYCODONE HCL 5 MG PO TABS: 5.0000 mg | ORAL_TABLET | Freq: Once | ORAL | Status: DC | PRN

## 2013-05-11 MED ORDER — LIDOCAINE HCL (CARDIAC) 20 MG/ML IV SOLN
INTRAVENOUS | Status: AC
  Filled 2013-05-11: qty 5

## 2013-05-11 SURGICAL SUPPLY — 50 items
BENZOIN TINCTURE PRP APPL 2/3 (GAUZE/BANDAGES/DRESSINGS) ×2 IMPLANT
BLADE SURG 10 STRL SS (BLADE) ×2 IMPLANT
BLADE SURG 11 STRL SS (BLADE) ×2 IMPLANT
BLADE SURG ROTATE 9660 (MISCELLANEOUS) ×2 IMPLANT
CANISTER SUCTION 2500CC (MISCELLANEOUS) ×2 IMPLANT
CAP-I-FUSE IMPLANT SYSTEM ×2 IMPLANT
CLOTH BEACON ORANGE TIMEOUT ST (SAFETY) ×2 IMPLANT
COVER SURGICAL LIGHT HANDLE (MISCELLANEOUS) ×4 IMPLANT
DRAPE C-ARM 42X72 X-RAY (DRAPES) IMPLANT
DRAPE C-ARMOR (DRAPES) ×2 IMPLANT
DRAPE INCISE IOBAN 66X45 STRL (DRAPES) ×2 IMPLANT
DRAPE POUCH INSTRU U-SHP 10X18 (DRAPES) ×2 IMPLANT
DRAPE SURG 17X23 STRL (DRAPES) ×6 IMPLANT
DURAPREP 26ML APPLICATOR (WOUND CARE) ×2 IMPLANT
ELECT CAUTERY BLADE 6.4 (BLADE) ×2 IMPLANT
ELECT REM PT RETURN 9FT ADLT (ELECTROSURGICAL) ×2
ELECTRODE REM PT RTRN 9FT ADLT (ELECTROSURGICAL) ×1 IMPLANT
GAUZE SPONGE 4X4 16PLY XRAY LF (GAUZE/BANDAGES/DRESSINGS) ×2 IMPLANT
GLOVE BIO SURGEON STRL SZ7 (GLOVE) ×2 IMPLANT
GLOVE BIO SURGEON STRL SZ8 (GLOVE) ×2 IMPLANT
GLOVE BIOGEL PI IND STRL 7.0 (GLOVE) ×1 IMPLANT
GLOVE BIOGEL PI IND STRL 8 (GLOVE) ×1 IMPLANT
GLOVE BIOGEL PI INDICATOR 7.0 (GLOVE) ×1
GLOVE BIOGEL PI INDICATOR 8 (GLOVE) ×1
GOWN STRL NON-REIN LRG LVL3 (GOWN DISPOSABLE) ×4 IMPLANT
GOWN STRL REIN XL XLG (GOWN DISPOSABLE) ×2 IMPLANT
KIT BASIN OR (CUSTOM PROCEDURE TRAY) ×2 IMPLANT
KIT ROOM TURNOVER OR (KITS) ×2 IMPLANT
MANIFOLD NEPTUNE II (INSTRUMENTS) ×2 IMPLANT
NEEDLE 22X1 1/2 (OR ONLY) (NEEDLE) ×2 IMPLANT
NEEDLE HYPO 25GX1X1/2 BEV (NEEDLE) ×2 IMPLANT
NS IRRIG 1000ML POUR BTL (IV SOLUTION) ×2 IMPLANT
PACK UNIVERSAL I (CUSTOM PROCEDURE TRAY) ×2 IMPLANT
PAD ARMBOARD 7.5X6 YLW CONV (MISCELLANEOUS) ×4 IMPLANT
PENCIL BUTTON HOLSTER BLD 10FT (ELECTRODE) ×2 IMPLANT
SPONGE GAUZE 4X4 12PLY (GAUZE/BANDAGES/DRESSINGS) ×2 IMPLANT
SPONGE LAP 18X18 X RAY DECT (DISPOSABLE) ×2 IMPLANT
STAPLER VISISTAT 35W (STAPLE) IMPLANT
STRIP CLOSURE SKIN 1/2X4 (GAUZE/BANDAGES/DRESSINGS) ×2 IMPLANT
SUT MNCRL AB 4-0 PS2 18 (SUTURE) ×2 IMPLANT
SUT VIC AB 0 CT1 18XCR BRD 8 (SUTURE) ×1 IMPLANT
SUT VIC AB 0 CT1 8-18 (SUTURE) ×1
SUT VIC AB 2-0 CT2 18 VCP726D (SUTURE) ×2 IMPLANT
SYR BULB IRRIGATION 50ML (SYRINGE) ×2 IMPLANT
SYR CONTROL 10ML LL (SYRINGE) ×2 IMPLANT
TOWEL OR 17X24 6PK STRL BLUE (TOWEL DISPOSABLE) ×2 IMPLANT
TOWEL OR 17X26 10 PK STRL BLUE (TOWEL DISPOSABLE) ×4 IMPLANT
TUBE CONNECTING 12X1/4 (SUCTIONS) ×2 IMPLANT
WATER STERILE IRR 1000ML POUR (IV SOLUTION) ×2 IMPLANT
YANKAUER SUCT BULB TIP NO VENT (SUCTIONS) ×2 IMPLANT

## 2013-05-11 NOTE — Preoperative (Signed)
Beta Blockers   Reason not to administer Beta Blockers:Not Applicable 

## 2013-05-11 NOTE — Anesthesia Postprocedure Evaluation (Signed)
  Anesthesia Post-op Note  Patient: Gina Velez  Procedure(s) Performed: Procedure(s) with comments: SACROILIAC JOINT FUSION (Right) - Right sided sacroiliac joint fusion  Patient Location: PACU  Anesthesia Type:General  Level of Consciousness: awake and alert   Airway and Oxygen Therapy: Patient Spontanous Breathing  Post-op Pain: moderate  Post-op Assessment: Post-op Vital signs reviewed, Patient's Cardiovascular Status Stable and Respiratory Function Stable  Post-op Vital Signs: Reviewed  Filed Vitals:   05/11/13 1745  BP: 116/62  Pulse: 91  Temp:   Resp: 16    Complications: No apparent anesthesia complications

## 2013-05-11 NOTE — Progress Notes (Signed)
Pt states pain is much better after valium and percocet, pt able to mobilize with crutches, sister feels more comfortable with patient going home, pt does not want to stay, discharged home with sister.

## 2013-05-11 NOTE — Progress Notes (Signed)
Sister at bedside, have discussed pain level and plan of care with patient and sister and pt continues to say she should be able to go home.  This RN has expressed concerns to patient regarding pain control at home because i have medicated her with 3mg  IV Dilaudid and 5mg  po Oxycodone and pt continues to moan intermittently.  She honestly believes she will be okay to go home.  She is willing to see if she can tolerate using crutches to see if she is okay to go home.

## 2013-05-11 NOTE — Progress Notes (Signed)
Orthopedic Tech Progress Note Patient Details:  Gina Velez [birth year] [id number]  Ortho Devices Type of Ortho Device: Crutches Ortho Device/Splint Interventions: Ordered;Adjustment   [name], [name] Craig 05/11/2013, 4:15 PM

## 2013-05-11 NOTE — H&P (Signed)
PREOPERATIVE H&P  Chief Complaint: right low back pain  HPI: Gina Velez is a 53 y.o. female who presents with ongoing pain in the right low back. Patient's sx's are c/w R SI dysfunction. Patient failed multiple forms of conservative care, including injections and therapy.   Past Medical History  Diagnosis Date  . Hyperlipidemia     but no meds needed at present borderline  . History of bronchitis     about 957yrs ago  . Seizures     30+yrs ago and no meds required(while giving birth)  . Weakness     numbness and tingling  . Muscle spasm of back     takes Zanaflex daily as needed  . Seasonal allergies     takes Singulair nightly and FLonase at bedtime  . Chronic joint pain   . Urinary frequency   . Urinary urgency   . Nocturia   . Anemia     was on meds but has been off x 357yrs ago  . Anxiety     takes Klonopin nightly and Lexapro daily  . Insomnia     doesn't take any meds   Past Surgical History  Procedure Laterality Date  . Tonsillectomy    . Abdominal hysterectomy     History   Social History  . Marital Status: Single    Spouse Name: N/A    Number of Children: N/A  . Years of Education: N/A   Social History Main Topics  . Smoking status: Former Games developermoker  . Smokeless tobacco: Not on file     Comment: quit smoking 1461yrs ago  . Alcohol Use: Yes     Comment: rarely  . Drug Use: No  . Sexual Activity: Not on file   Other Topics Concern  . Not on file   Social History Narrative  . No narrative on file   No family history on file. No Known Allergies Prior to Admission medications   Medication Sig Start Date End Date Taking? Authorizing Provider  clonazePAM (KLONOPIN) 0.5 MG tablet Take 0.5 mg by mouth at bedtime.   Yes Historical Provider, MD  escitalopram (LEXAPRO) 10 MG tablet Take 10 mg by mouth daily.   Yes Historical Provider, MD  fluticasone (FLONASE) 50 MCG/ACT nasal spray Place 1 spray into both nostrils at bedtime.   Yes Historical Provider, MD   montelukast (SINGULAIR) 10 MG tablet Take 10 mg by mouth at bedtime.   Yes Historical Provider, MD  Oxycodone-Acetaminophen ER (XARTEMIS XR) 7.5-325 MG TBCR Take 1 tablet by mouth 2 (two) times daily as needed (for pain).   Yes Historical Provider, MD  tiZANidine (ZANAFLEX) 4 MG tablet Take 4 mg by mouth every 8 (eight) hours as needed for muscle spasms.   Yes Historical Provider, MD     All other systems have been reviewed and were otherwise negative with the exception of those mentioned in the HPI and as above.  Physical Exam: There were no vitals filed for this visit.  General: Alert, no acute distress Cardiovascular: No pedal edema Respiratory: No cyanosis, no use of accessory musculature GI: No organomegaly, abdomen is soft and non-tender Skin: No lesions in the area of chief complaint Neurologic: Sensation intact distally Psychiatric: Patient is competent for consent with normal mood and affect Lymphatic: No axillary or cervical lymphadenopathy  MUSCULOSKELETAL: + TTP R SI joint region  Assessment/Plan: Right sacroiliac joint dysfunction Plan for Procedure(s): SACROILIAC JOINT FUSION   Emilee HeroUMONSKI,Sophie Quiles LEONARD, MD 05/11/2013 8:01 AM

## 2013-05-11 NOTE — Anesthesia Preprocedure Evaluation (Addendum)
Anesthesia Evaluation  Patient identified by MRN, date of birth, ID band Patient awake    Reviewed: Allergy & Precautions, H&P , NPO status , Patient's Chart, lab work & pertinent test results, reviewed documented beta blocker date and time   Airway Mallampati: II TM Distance: >3 FB Neck ROM: Full    Dental   Pulmonary COPD COPD inhaler, former smoker,    Pulmonary exam normal       Cardiovascular  '07 ECHO: EF 65%, valves OK   Neuro/Psych Chronic back pain: narcotics    GI/Hepatic   Endo/Other    Renal/GU      Musculoskeletal   Abdominal Normal abdominal exam  (+)   Peds  Hematology   Anesthesia Other Findings   Reproductive/Obstetrics                         Anesthesia Physical Anesthesia Plan  ASA: II  Anesthesia Plan: General   Post-op Pain Management:    Induction: Intravenous  Airway Management Planned: Oral ETT  Additional Equipment:   Intra-op Plan:   Post-operative Plan: Extubation in OR  Informed Consent: I have reviewed the patients History and Physical, chart, labs and discussed the procedure including the risks, benefits and alternatives for the proposed anesthesia with the patient or authorized representative who has indicated his/her understanding and acceptance.   Dental advisory given  Plan Discussed with: CRNA, Anesthesiologist and Surgeon  Anesthesia Plan Comments:         Anesthesia Quick Evaluation

## 2013-05-11 NOTE — Transfer of Care (Signed)
Immediate Anesthesia Transfer of Care Note  Patient: Gina Velez  Procedure(s) Performed: Procedure(s) with comments: SACROILIAC JOINT FUSION (Right) - Right sided sacroiliac joint fusion  Patient Location: PACU  Anesthesia Type:General  Level of Consciousness: awake, alert  and oriented  Airway & Oxygen Therapy: Patient Spontanous Breathing and Patient connected to face mask oxygen  Post-op Assessment: Report given to PACU RN  Post vital signs: Reviewed and stable  Complications: No apparent anesthesia complications

## 2013-05-11 NOTE — Progress Notes (Signed)
pt was unable to take a step with crutches without pain 9/10, pt returned to recliner, Spoke with Jason CoopKayla McKenzie regarding pt status, instructed to give po valium and percocet and see if patient can tolerate going home, po meds given as ordered, family updated on pt status, will cont to monitor/

## 2013-05-12 ENCOUNTER — Encounter (HOSPITAL_COMMUNITY): Payer: Self-pay | Admitting: Orthopedic Surgery

## 2013-05-12 NOTE — Op Note (Signed)
NAME:  Gina Velez, Gina Velez NO.:  [id number]  MEDICAL RECORD NO.:  [id number]  LOCATION:  [facility]                         FACILITY:  [facility]  PHYSICIAN:  Estill Bamberg, MD      DATE OF BIRTH:  12-31-1960  DATE OF PROCEDURE:  05/11/2013 DATE OF DISCHARGE:  05/11/2013                              OPERATIVE REPORT   PREOPERATIVE DIAGNOSIS:  Right sacroiliac joint dysfunction.  POSTOPERATIVE DIAGNOSIS:  Right sacroiliac joint dysfunction.  PROCEDURE:  Right-sided sacroiliac joint fusion using the iFuse sacroiliac joint fusion system.  SURGEON:  Estill Bamberg, MD  ASSISTANT:  Jason Coop, PA-C  ANESTHESIA:  General endotracheal anesthesia.  COMPLICATIONS:  None.  DISPOSITION:  Stable.  ESTIMATED BLOOD LOSS:  Minimal.  INDICATIONS FOR PROCEDURE:  Briefly, [name] is a very pleasant 53- year-old female who did present to me with substantial pain at the right side of her low back.  Her symptoms and exam was consistent with right sacroiliac joint dysfunction.  She did get temporary relief with a right sacroiliac joint injection, but did continue to have severe and substantial pain.  She did fail multiple forms of conservative care, did continue to have pain.  We therefore did discuss the pros and cons of a fusion procedure.  We did discuss the pros and cons of right sacroiliac joint fusion procedure.  The patient did fully understand the risks and limitations of the procedure, including the risk of nonunion and possible lack of alleviation of her pain.  She did wish to proceed.  OPERATIVE DETAILS:  On May 11, 2013, the patient was brought to surgery and general endotracheal anesthesia was administered.  The patient was placed prone on a flat Jackson bed.  Gelrolls were placed on the patient's chest and hips.  Antibiotics were given and a time-out procedure was performed.  The region of the right buttock was prepped and draped in the usual sterile fashion.  I  did liberally use fluoroscopy and I did Bertrum Helmstetter out the posterior border of the sacrum.  A 3 cm incision was made in line with the posterior border of the sacrum.  I then advanced 3 guidewires across the sacroiliac joint, 1 above the S1 foramen, 1 in line with it, and 1 just beneath it.  I did liberally use lateral inlet and outlet fluoroscopic views while advancing the guidewires to confirm appropriate trajectory of the guidewires.  I then drilled and broached over the guidewires.  I then advanced 7 mm implants of the appropriate length across each of the guidewires.  I was very pleased with the press fit of each of the guidewires.  The wound was then copiously irrigated.  The fascia was closed using #1 Vicryl.  The subcutaneous layer was closed using 2-0 Vicryl and the skin was closed using 3-0 Monocryl.  Benzoin and Steri-Strips were applied followed by sterile dressing.  All instrument counts were correct at the termination of the procedure.  Of note, Jason Coop, was my assistant throughout the entirety of the procedure, and did aid in essential retraction and suctioning needed throughout the entirety of the surgery.     Estill Bamberg, MD     MD/MEDQ  D:  05/11/2013  T:  05/12/2013  Job:  [id number]  cc:   Dr. Mardelle MatteAndy

## 2014-01-09 ENCOUNTER — Other Ambulatory Visit: Payer: Self-pay | Admitting: Family Medicine

## 2014-01-09 DIAGNOSIS — Z1239 Encounter for other screening for malignant neoplasm of breast: Secondary | ICD-10-CM

## 2014-01-19 ENCOUNTER — Other Ambulatory Visit: Payer: Self-pay | Admitting: Family Medicine

## 2014-01-19 DIAGNOSIS — Z1231 Encounter for screening mammogram for malignant neoplasm of breast: Secondary | ICD-10-CM

## 2014-01-25 ENCOUNTER — Ambulatory Visit: Payer: Managed Care, Other (non HMO)

## 2014-02-06 ENCOUNTER — Encounter (INDEPENDENT_AMBULATORY_CARE_PROVIDER_SITE_OTHER): Payer: Self-pay

## 2014-02-06 ENCOUNTER — Ambulatory Visit
Admission: RE | Admit: 2014-02-06 | Discharge: 2014-02-06 | Disposition: A | Discharge status: Home / Self Care | Payer: Managed Care, Other (non HMO) | Source: Ambulatory Visit | Attending: Family Medicine | Admitting: Family Medicine

## 2014-02-06 DIAGNOSIS — Z1231 Encounter for screening mammogram for malignant neoplasm of breast: Secondary | ICD-10-CM

## 2014-05-04 ENCOUNTER — Other Ambulatory Visit: Payer: Self-pay | Admitting: Family Medicine

## 2014-05-04 ENCOUNTER — Ambulatory Visit
Admission: RE | Admit: 2014-05-04 | Discharge: 2014-05-04 | Disposition: A | Discharge status: Home / Self Care | Payer: BLUE CROSS/BLUE SHIELD | Source: Ambulatory Visit | Attending: Family Medicine | Admitting: Family Medicine

## 2014-05-04 DIAGNOSIS — M79644 Pain in right finger(s): Secondary | ICD-10-CM

## 2015-03-20 ENCOUNTER — Other Ambulatory Visit: Payer: Self-pay

## 2015-03-20 DIAGNOSIS — Z1231 Encounter for screening mammogram for malignant neoplasm of breast: Secondary | ICD-10-CM

## 2015-04-17 ENCOUNTER — Ambulatory Visit: Payer: BLUE CROSS/BLUE SHIELD

## 2017-05-01 DEATH — deceased

## 2017-10-26 DIAGNOSIS — Z6829 Body mass index (BMI) 29.0-29.9, adult: Secondary | ICD-10-CM | POA: Diagnosis not present

## 2017-10-26 DIAGNOSIS — R21 Rash and other nonspecific skin eruption: Secondary | ICD-10-CM | POA: Diagnosis not present

## 2017-11-09 ENCOUNTER — Other Ambulatory Visit: Payer: Self-pay | Admitting: Family Medicine

## 2017-11-09 DIAGNOSIS — Z1231 Encounter for screening mammogram for malignant neoplasm of breast: Secondary | ICD-10-CM

## 2017-11-09 DIAGNOSIS — B081 Molluscum contagiosum: Secondary | ICD-10-CM | POA: Diagnosis not present

## 2017-11-09 DIAGNOSIS — Z6829 Body mass index (BMI) 29.0-29.9, adult: Secondary | ICD-10-CM | POA: Diagnosis not present

## 2017-11-09 DIAGNOSIS — R21 Rash and other nonspecific skin eruption: Secondary | ICD-10-CM | POA: Diagnosis not present

## 2017-12-03 ENCOUNTER — Ambulatory Visit
Admission: RE | Admit: 2017-12-03 | Discharge: 2017-12-03 | Disposition: A | Discharge status: Home / Self Care | Payer: BLUE CROSS/BLUE SHIELD | Source: Ambulatory Visit | Attending: Family Medicine | Admitting: Family Medicine

## 2017-12-03 DIAGNOSIS — Z1231 Encounter for screening mammogram for malignant neoplasm of breast: Secondary | ICD-10-CM

## 2017-12-04 ENCOUNTER — Other Ambulatory Visit: Payer: Self-pay | Admitting: Family Medicine

## 2017-12-04 DIAGNOSIS — E785 Hyperlipidemia, unspecified: Secondary | ICD-10-CM | POA: Diagnosis not present

## 2017-12-04 DIAGNOSIS — Z1329 Encounter for screening for other suspected endocrine disorder: Secondary | ICD-10-CM | POA: Diagnosis not present

## 2017-12-04 DIAGNOSIS — Z8719 Personal history of other diseases of the digestive system: Secondary | ICD-10-CM

## 2017-12-04 DIAGNOSIS — R21 Rash and other nonspecific skin eruption: Secondary | ICD-10-CM | POA: Diagnosis not present

## 2017-12-04 DIAGNOSIS — R1011 Right upper quadrant pain: Secondary | ICD-10-CM

## 2017-12-04 DIAGNOSIS — Z0000 Encounter for general adult medical examination without abnormal findings: Secondary | ICD-10-CM | POA: Diagnosis not present

## 2017-12-04 DIAGNOSIS — R928 Other abnormal and inconclusive findings on diagnostic imaging of breast: Secondary | ICD-10-CM

## 2017-12-04 DIAGNOSIS — Z114 Encounter for screening for human immunodeficiency virus [HIV]: Secondary | ICD-10-CM | POA: Diagnosis not present

## 2017-12-04 DIAGNOSIS — Z23 Encounter for immunization: Secondary | ICD-10-CM | POA: Diagnosis not present

## 2017-12-04 DIAGNOSIS — R739 Hyperglycemia, unspecified: Secondary | ICD-10-CM | POA: Diagnosis not present

## 2017-12-04 DIAGNOSIS — K8020 Calculus of gallbladder without cholecystitis without obstruction: Secondary | ICD-10-CM | POA: Diagnosis not present

## 2017-12-04 DIAGNOSIS — J3089 Other allergic rhinitis: Secondary | ICD-10-CM | POA: Diagnosis not present

## 2017-12-04 DIAGNOSIS — Z6828 Body mass index (BMI) 28.0-28.9, adult: Secondary | ICD-10-CM | POA: Diagnosis not present

## 2017-12-09 ENCOUNTER — Ambulatory Visit: Payer: BLUE CROSS/BLUE SHIELD

## 2017-12-09 ENCOUNTER — Ambulatory Visit
Admission: RE | Admit: 2017-12-09 | Discharge: 2017-12-09 | Disposition: A | Discharge status: Home / Self Care | Payer: BLUE CROSS/BLUE SHIELD | Source: Ambulatory Visit | Attending: Family Medicine | Admitting: Family Medicine

## 2017-12-09 DIAGNOSIS — R928 Other abnormal and inconclusive findings on diagnostic imaging of breast: Secondary | ICD-10-CM

## 2017-12-09 DIAGNOSIS — R922 Inconclusive mammogram: Secondary | ICD-10-CM | POA: Diagnosis not present

## 2017-12-11 ENCOUNTER — Other Ambulatory Visit: Payer: BLUE CROSS/BLUE SHIELD

## 2017-12-16 DIAGNOSIS — Z1159 Encounter for screening for other viral diseases: Secondary | ICD-10-CM | POA: Diagnosis not present

## 2017-12-16 DIAGNOSIS — Z6829 Body mass index (BMI) 29.0-29.9, adult: Secondary | ICD-10-CM | POA: Diagnosis not present

## 2017-12-16 DIAGNOSIS — Z23 Encounter for immunization: Secondary | ICD-10-CM | POA: Diagnosis not present

## 2017-12-16 DIAGNOSIS — Z0000 Encounter for general adult medical examination without abnormal findings: Secondary | ICD-10-CM | POA: Diagnosis not present

## 2017-12-24 ENCOUNTER — Ambulatory Visit
Admission: RE | Admit: 2017-12-24 | Discharge: 2017-12-24 | Disposition: A | Discharge status: Home / Self Care | Payer: BLUE CROSS/BLUE SHIELD | Source: Ambulatory Visit | Attending: Family Medicine | Admitting: Family Medicine

## 2017-12-24 DIAGNOSIS — R1011 Right upper quadrant pain: Secondary | ICD-10-CM

## 2017-12-24 DIAGNOSIS — Z8719 Personal history of other diseases of the digestive system: Secondary | ICD-10-CM

## 2017-12-24 DIAGNOSIS — K8020 Calculus of gallbladder without cholecystitis without obstruction: Secondary | ICD-10-CM | POA: Diagnosis not present

## 2018-01-20 DIAGNOSIS — K8020 Calculus of gallbladder without cholecystitis without obstruction: Secondary | ICD-10-CM | POA: Diagnosis not present

## 2018-01-20 DIAGNOSIS — J069 Acute upper respiratory infection, unspecified: Secondary | ICD-10-CM | POA: Diagnosis not present

## 2018-01-20 DIAGNOSIS — Z6828 Body mass index (BMI) 28.0-28.9, adult: Secondary | ICD-10-CM | POA: Diagnosis not present

## 2018-01-20 DIAGNOSIS — Z23 Encounter for immunization: Secondary | ICD-10-CM | POA: Diagnosis not present

## 2018-01-20 DIAGNOSIS — J3089 Other allergic rhinitis: Secondary | ICD-10-CM | POA: Diagnosis not present

## 2018-02-10 ENCOUNTER — Encounter: Payer: Self-pay | Admitting: Gastroenterology

## 2018-02-10 DIAGNOSIS — K8020 Calculus of gallbladder without cholecystitis without obstruction: Secondary | ICD-10-CM | POA: Diagnosis not present

## 2018-03-03 ENCOUNTER — Other Ambulatory Visit: Payer: Self-pay

## 2018-03-03 ENCOUNTER — Ambulatory Visit (AMBULATORY_SURGERY_CENTER): Payer: Self-pay | Admitting: *Deleted

## 2018-03-03 VITALS — Ht 65.0 in | Wt 175.0 lb

## 2018-03-03 DIAGNOSIS — Z1211 Encounter for screening for malignant neoplasm of colon: Secondary | ICD-10-CM

## 2018-03-03 MED ORDER — SUPREP BOWEL PREP KIT 17.5-3.13-1.6 GM/177ML PO SOLN: 1.0000 | Freq: Once | ORAL | 0 refills | Status: AC

## 2018-03-03 NOTE — Progress Notes (Signed)
No egg or soy allergy known to patient  No issues with past sedation with any surgeries  or procedures, no intubation problems  No diet pills per patient No home 02 use per patient  No blood thinners per patient  Pt denies issues with constipation  No A fib or A flutter  EMMI video sent to pt's e mail  No seizure in greater than 30 years ago suprep 15.00 coupon given

## 2018-03-04 ENCOUNTER — Ambulatory Visit: Payer: Self-pay | Admitting: Family Medicine

## 2018-03-04 ENCOUNTER — Encounter: Payer: Self-pay | Admitting: Gastroenterology

## 2018-03-11 ENCOUNTER — Ambulatory Visit (AMBULATORY_SURGERY_CENTER): Payer: BLUE CROSS/BLUE SHIELD | Admitting: Gastroenterology

## 2018-03-11 ENCOUNTER — Encounter: Payer: BLUE CROSS/BLUE SHIELD | Admitting: Gastroenterology

## 2018-03-11 ENCOUNTER — Encounter: Payer: Self-pay | Admitting: Gastroenterology

## 2018-03-11 VITALS — BP 132/71 | HR 67 | Temp 97.3°F | Resp 11 | Ht 65.0 in | Wt 175.0 lb

## 2018-03-11 DIAGNOSIS — Z1211 Encounter for screening for malignant neoplasm of colon: Secondary | ICD-10-CM

## 2018-03-11 MED ORDER — SODIUM CHLORIDE 0.9 % IV SOLN: 500.0000 mL | Freq: Once | INTRAVENOUS | Status: DC

## 2018-03-11 NOTE — Progress Notes (Signed)
PT taken to PACU. Monitors in place. VSS. Report given to RN. 

## 2018-03-11 NOTE — Op Note (Signed)
[facility] Patient Name: Gina Velez Procedure Date: 03/11/2018 8:41 AM MRN: [id number] Endoscopist: Doristine Locks , MD Age: 57 Referring MD:  Date of Birth: [birth year] Gender: Female Account #: [id number] Procedure:                Colonoscopy Indications:              Screening for colorectal malignant neoplasm, This                            is the patient's first colonoscopy Medicines:                Monitored Anesthesia Care Procedure:                Pre-Anesthesia Assessment:                           - Prior to the procedure, a History and Physical                            was performed, and patient medications and                            allergies were reviewed. The patient's tolerance of                            previous anesthesia was also reviewed. The risks                            and benefits of the procedure and the sedation                            options and risks were discussed with the patient.                            All questions were answered, and informed consent                            was obtained. Prior Anticoagulants: The patient has                            taken no previous anticoagulant or antiplatelet                            agents. ASA Grade Assessment: II - A patient with                            mild systemic disease. After reviewing the risks                            and benefits, the patient was deemed in                            satisfactory condition to undergo the procedure.  After obtaining informed consent, the colonoscope                            was passed under direct vision. Throughout the                            procedure, the patient's blood pressure, pulse, and                            oxygen saturations were monitored continuously. The                            Colonoscope was introduced through the anus and                            advanced to the the  cecum, identified by                            appendiceal orifice and ileocecal valve. The                            colonoscopy was performed without difficulty. The                            patient tolerated the procedure well. The quality                            of the bowel preparation was adequate. Scope In: 8:54:05 AM Scope Out: 9:09:07 AM Scope Withdrawal Time: 0 hours 11 minutes 29 seconds  Total Procedure Duration: 0 hours 15 minutes 2 seconds  Findings:                 The perianal and digital rectal examinations were                            normal.                           The colon appeared normal.                           The retroflexed view of the distal rectum and anal                            verge was normal and showed no anal or rectal                            abnormalities. Complications:            No immediate complications. Estimated Blood Loss:     Estimated blood loss: none. Impression:               - The entire examined colon is normal.                           - The distal rectum and anal verge are  normal on                            retroflexion view.                           - No specimens collected. Recommendation:           - Patient has a contact number available for                            emergencies. The signs and symptoms of potential                            delayed complications were discussed with the                            patient. Return to normal activities tomorrow.                            Written discharge instructions were provided to the                            patient.                           - Resume previous diet today.                           - Continue present medications.                           - Repeat colonoscopy in 10 years for screening                            purposes.                           - Return to GI office PRN. Doristine Locks, MD 03/11/2018 9:12:11 AM

## 2018-03-11 NOTE — Patient Instructions (Signed)
YOU HAD AN ENDOSCOPIC PROCEDURE TODAY AT THE [facility]:   Refer to the procedure report that was given to you for any specific questions about what was found during the examination.  If the procedure report does not answer your questions, please call your gastroenterologist to clarify.  If you requested that your care partner not be given the details of your procedure findings, then the procedure report has been included in a sealed envelope for you to review at your convenience later.  YOU SHOULD EXPECT: Some feelings of bloating in the abdomen. Passage of more gas than usual.  Walking can help get rid of the air that was put into your GI tract during the procedure and reduce the bloating. If you had a lower endoscopy (such as a colonoscopy or flexible sigmoidoscopy) you may notice spotting of blood in your stool or on the toilet paper. If you underwent a bowel prep for your procedure, you may not have a normal bowel movement for a few days.  Please Note:  You might notice some irritation and congestion in your nose or some drainage.  This is from the oxygen used during your procedure.  There is no need for concern and it should clear up in a day or so.  SYMPTOMS TO REPORT IMMEDIATELY:   Following lower endoscopy (colonoscopy or flexible sigmoidoscopy):  Excessive amounts of blood in the stool  Significant tenderness or worsening of abdominal pains  Swelling of the abdomen that is new, acute  Fever of 100F or higher  For urgent or emergent issues, a gastroenterologist can be reached at any hour by calling [phone].   DIET:  We do recommend a small meal at first, but then you may proceed to your regular diet.  Drink plenty of fluids but you should avoid alcoholic beverages for 24 hours.  ACTIVITY:  You should plan to take it easy for the rest of today and you should NOT DRIVE or use heavy machinery until tomorrow (because of the sedation medicines used during the test).     FOLLOW UP: Our staff will call the number listed on your records the next business day following your procedure to check on you and address any questions or concerns that you may have regarding the information given to you following your procedure. If we do not reach you, we will leave a message.  However, if you are feeling well and you are not experiencing any problems, there is no need to return our call.  We will assume that you have returned to your regular daily activities without incident.  If any biopsies were taken you will be contacted by phone or by letter within the next 1-3 weeks.  Please call us at [phone] if you have not heard about the biopsies in 3 weeks.    SIGNATURES/CONFIDENTIALITY: You and/or your care partner have signed paperwork which will be entered into your electronic medical record.  These signatures attest to the fact that that the information above on your After Visit Summary has been reviewed and is understood.  Full responsibility of the confidentiality of this discharge information lies with you and/or your care-partner. 

## 2018-03-11 NOTE — Progress Notes (Signed)
Pt's states no medical or surgical changes since previsit or office visit. 

## 2018-03-12 ENCOUNTER — Telehealth: Payer: Self-pay

## 2018-03-12 ENCOUNTER — Telehealth: Payer: Self-pay | Admitting: *Deleted

## 2018-03-12 NOTE — Telephone Encounter (Signed)
  Follow up Call-  Call back number 03/11/2018  Post procedure Call Back phone  # [phone]  Permission to leave phone message Yes  Some recent data might be hidden     Left message

## 2018-03-12 NOTE — Telephone Encounter (Signed)
  Follow up Call-  Call back number 03/11/2018  Post procedure Call Back phone  # [phone]  Permission to leave phone message Yes  Some recent data might be hidden    [facility]

## 2018-04-21 DIAGNOSIS — L810 Postinflammatory hyperpigmentation: Secondary | ICD-10-CM | POA: Diagnosis not present

## 2018-06-16 DIAGNOSIS — E1165 Type 2 diabetes mellitus with hyperglycemia: Secondary | ICD-10-CM | POA: Diagnosis not present

## 2018-06-16 DIAGNOSIS — E663 Overweight: Secondary | ICD-10-CM | POA: Diagnosis not present

## 2018-06-16 DIAGNOSIS — E785 Hyperlipidemia, unspecified: Secondary | ICD-10-CM | POA: Diagnosis not present

## 2018-06-16 DIAGNOSIS — K8020 Calculus of gallbladder without cholecystitis without obstruction: Secondary | ICD-10-CM | POA: Diagnosis not present

## 2018-06-18 DIAGNOSIS — R002 Palpitations: Secondary | ICD-10-CM | POA: Diagnosis not present

## 2018-06-18 DIAGNOSIS — F331 Major depressive disorder, recurrent, moderate: Secondary | ICD-10-CM | POA: Diagnosis not present

## 2018-06-18 DIAGNOSIS — F411 Generalized anxiety disorder: Secondary | ICD-10-CM | POA: Diagnosis not present

## 2018-06-23 DIAGNOSIS — F411 Generalized anxiety disorder: Secondary | ICD-10-CM | POA: Diagnosis not present

## 2018-06-23 DIAGNOSIS — Z719 Counseling, unspecified: Secondary | ICD-10-CM | POA: Diagnosis not present

## 2018-06-23 DIAGNOSIS — G4700 Insomnia, unspecified: Secondary | ICD-10-CM | POA: Diagnosis not present

## 2018-06-23 DIAGNOSIS — R202 Paresthesia of skin: Secondary | ICD-10-CM | POA: Diagnosis not present

## 2018-09-15 DIAGNOSIS — J3089 Other allergic rhinitis: Secondary | ICD-10-CM | POA: Diagnosis not present

## 2018-09-15 DIAGNOSIS — G4700 Insomnia, unspecified: Secondary | ICD-10-CM | POA: Diagnosis not present

## 2018-09-15 DIAGNOSIS — F411 Generalized anxiety disorder: Secondary | ICD-10-CM | POA: Diagnosis not present

## 2018-11-03 IMAGING — MG DIGITAL DIAGNOSTIC BILATERAL MAMMOGRAM WITH TOMO AND CAD
8 series · 9 of 24 positions shown · non-contrast
Comparison: Previous exam(s).

CLINICAL DATA: 57-year-old female recalled from 2D screening
mammogram dated 12/03/2017 for bilateral asymmetries.

EXAM:
DIGITAL DIAGNOSTIC BILATERAL MAMMOGRAM WITH CAD AND TOMO

[R MLO synth-2D]
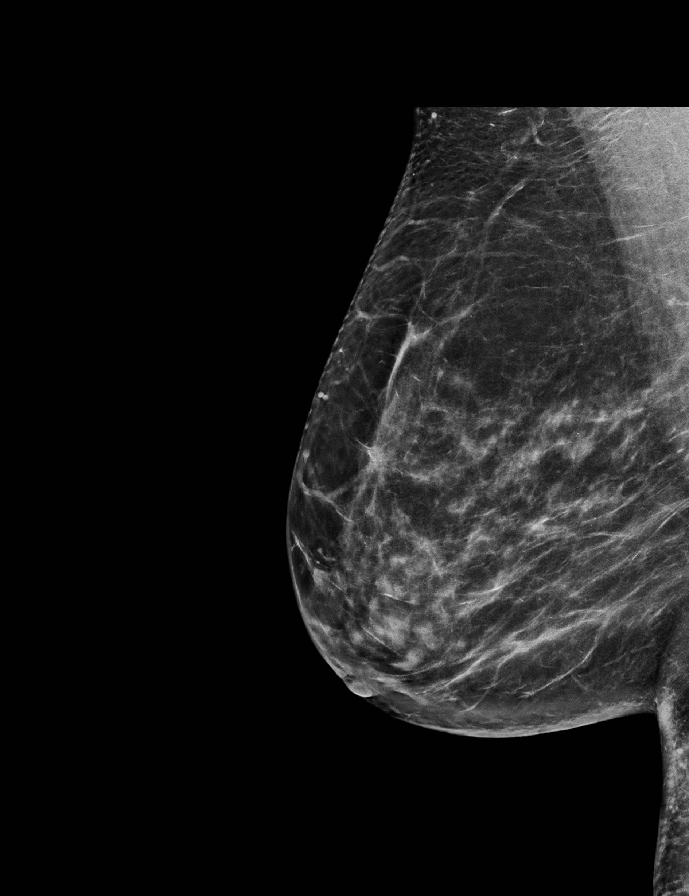

[L MLO synth-2D]
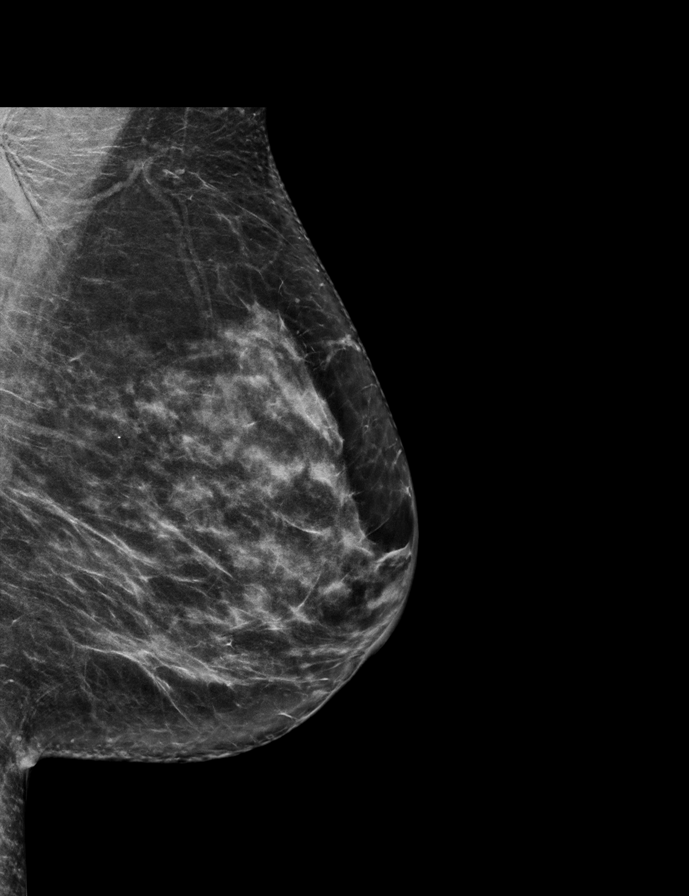

[R CC synth-2D]
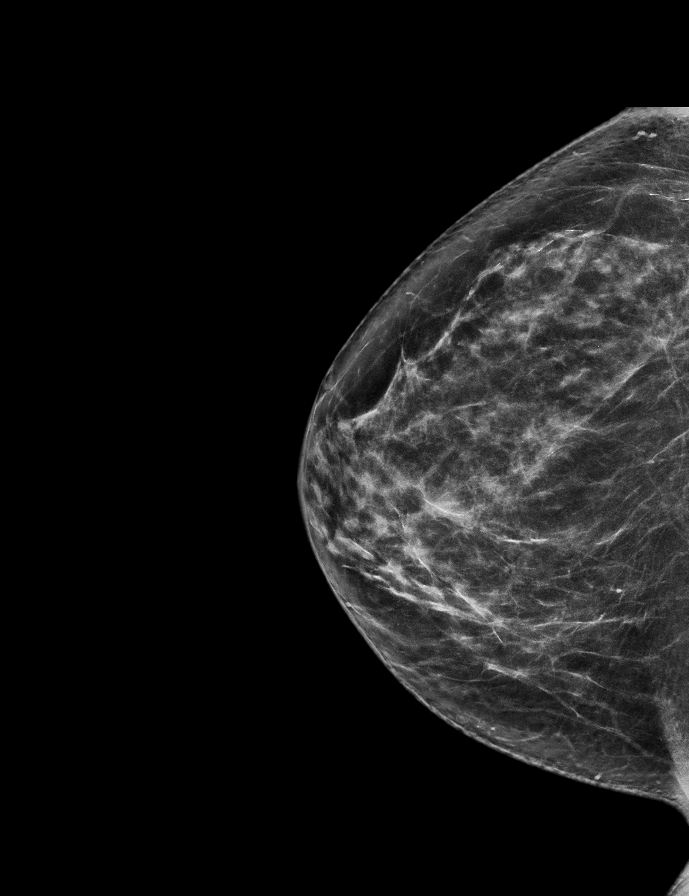

[L CC synth-2D]
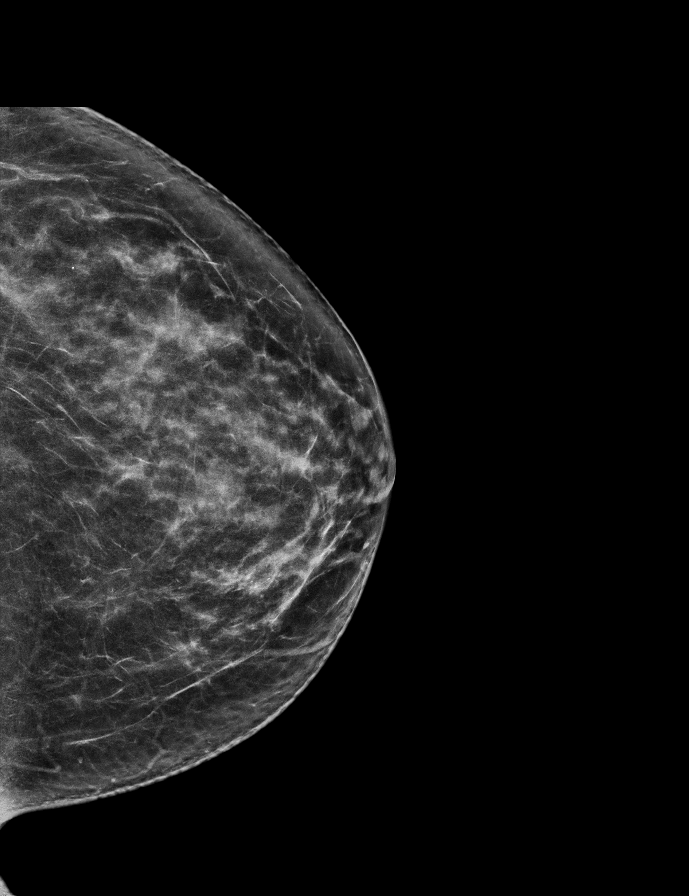

[R MLO tomo · 2 of 73 frames shown]
[frame 24/73]
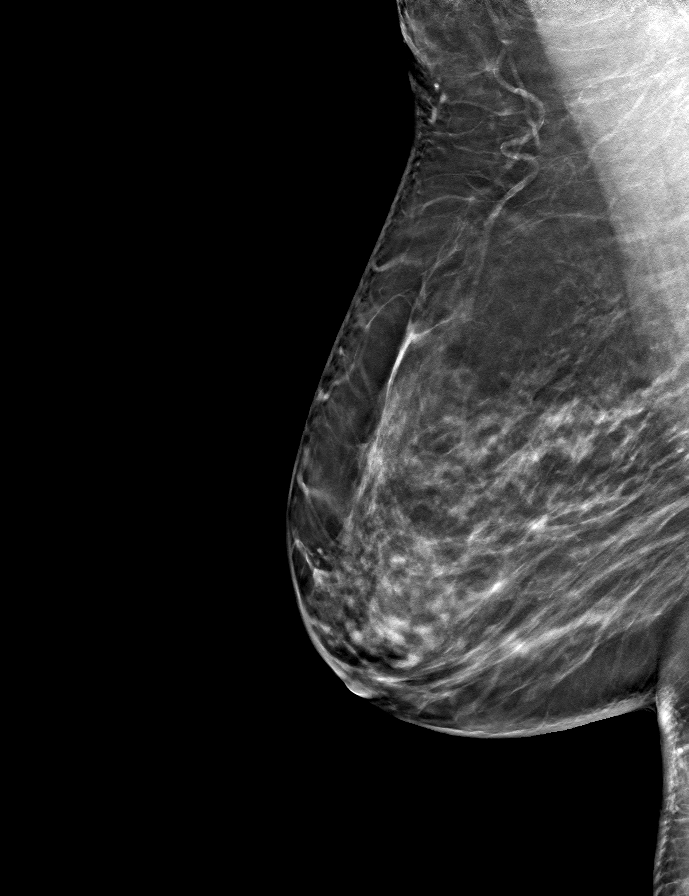
[frame 37/73]
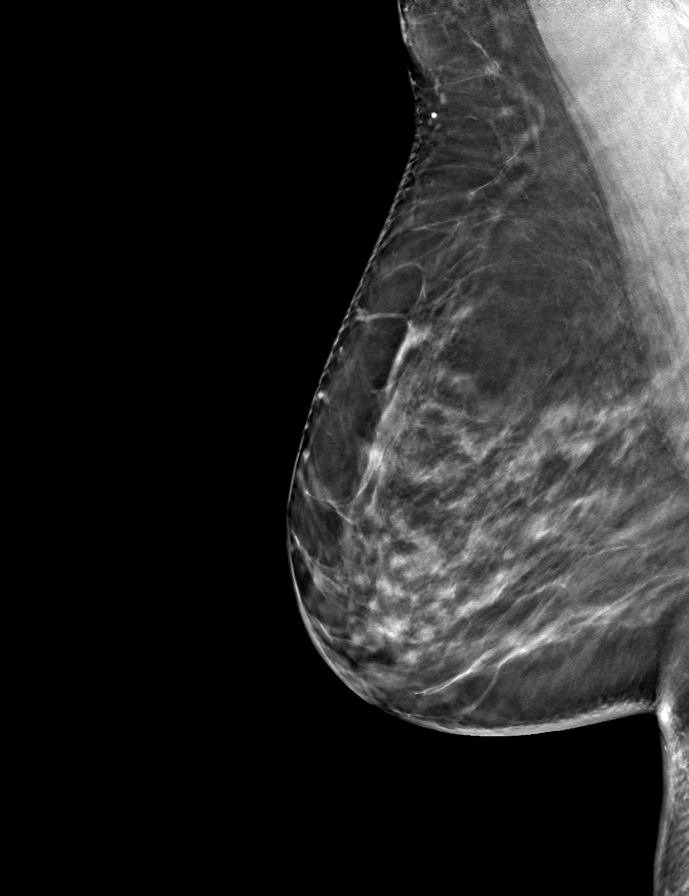

[L MLO tomo · tomo slice 36/71.0]
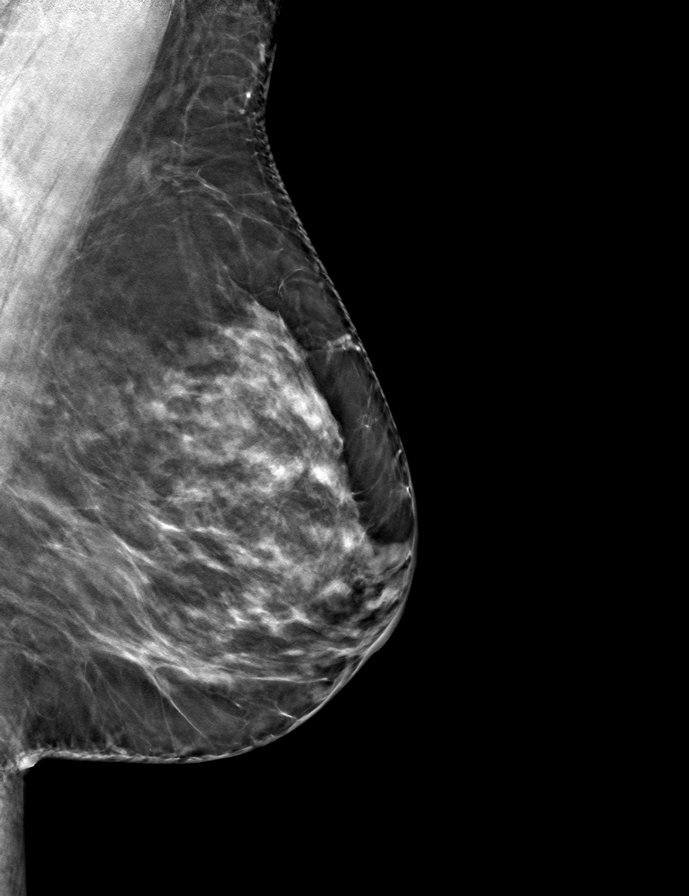

[R CC tomo · tomo slice 35/69.0]
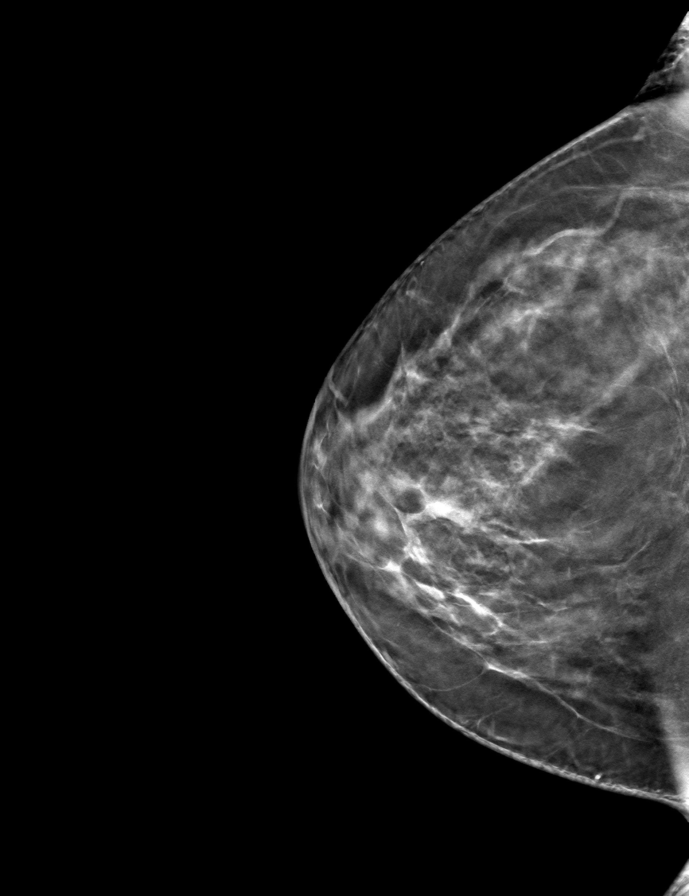

[L CC tomo · tomo slice 33/66.0]
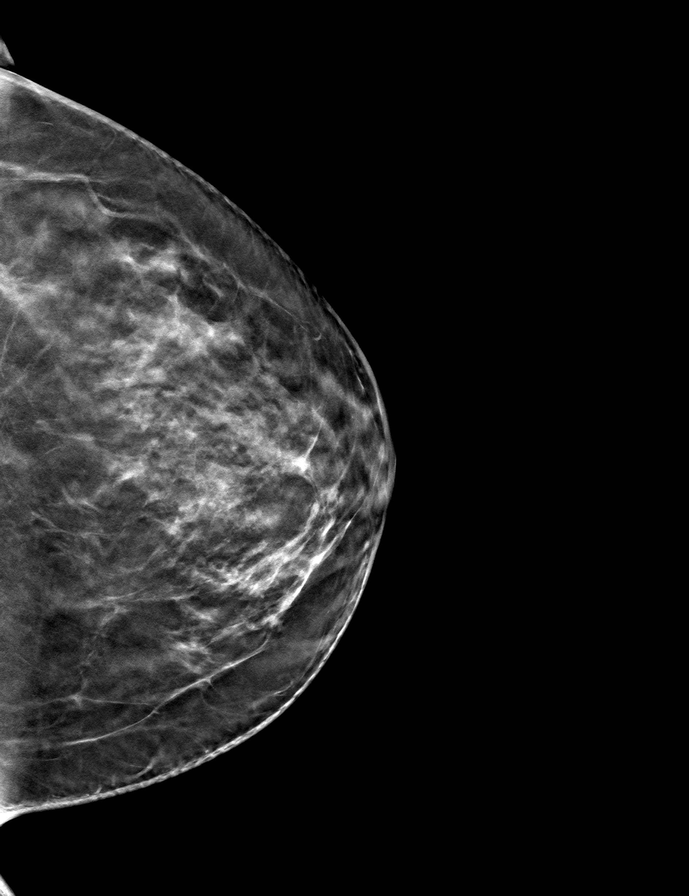

[9 of 24 positions shown; findings below may reference images not displayed]

ACR Breast Density Category c: The breast tissue is heterogeneously
dense, which may obscure small masses.
FINDINGS: Previously described asymmetries in the inferior right breast at
middle depth on the MLO projection and central left breast at middle
depth on the CC projection both resolve into well dispersed fatty
and fibroglandular tissue on today's additional 3D views. No
suspicious mammographic findings are identified.

Mammographic images were processed with CAD.
IMPRESSION: No mammographic evidence of malignancy in either breast.

RECOMMENDATION:
Screening mammogram in one year.(Code:O5-M-Z35)

I have discussed the findings and recommendations with the patient.
Results were also provided in writing at the conclusion of the
visit. If applicable, a reminder letter will be sent to the patient
regarding the next appointment.

BI-RADS CATEGORY  1: Negative.

## 2019-03-03 DIAGNOSIS — G4700 Insomnia, unspecified: Secondary | ICD-10-CM | POA: Diagnosis not present

## 2019-03-03 DIAGNOSIS — F331 Major depressive disorder, recurrent, moderate: Secondary | ICD-10-CM | POA: Diagnosis not present

## 2019-03-03 DIAGNOSIS — F411 Generalized anxiety disorder: Secondary | ICD-10-CM | POA: Diagnosis not present

## 2019-05-18 DIAGNOSIS — H6693 Otitis media, unspecified, bilateral: Secondary | ICD-10-CM | POA: Diagnosis not present

## 2019-05-18 DIAGNOSIS — J309 Allergic rhinitis, unspecified: Secondary | ICD-10-CM | POA: Diagnosis not present

## 2019-05-18 DIAGNOSIS — Z03818 Encounter for observation for suspected exposure to other biological agents ruled out: Secondary | ICD-10-CM | POA: Diagnosis not present

## 2019-05-18 DIAGNOSIS — R519 Headache, unspecified: Secondary | ICD-10-CM | POA: Diagnosis not present

## 2019-05-18 DIAGNOSIS — J329 Chronic sinusitis, unspecified: Secondary | ICD-10-CM | POA: Diagnosis not present

## 2019-07-13 DIAGNOSIS — G4700 Insomnia, unspecified: Secondary | ICD-10-CM | POA: Diagnosis not present

## 2019-07-13 DIAGNOSIS — F331 Major depressive disorder, recurrent, moderate: Secondary | ICD-10-CM | POA: Diagnosis not present

## 2019-07-13 DIAGNOSIS — F411 Generalized anxiety disorder: Secondary | ICD-10-CM | POA: Diagnosis not present

## 2019-08-19 ENCOUNTER — Other Ambulatory Visit: Payer: Self-pay | Admitting: Family Medicine

## 2019-08-19 DIAGNOSIS — Z1231 Encounter for screening mammogram for malignant neoplasm of breast: Secondary | ICD-10-CM

## 2019-08-24 DIAGNOSIS — E785 Hyperlipidemia, unspecified: Secondary | ICD-10-CM | POA: Diagnosis not present

## 2019-08-24 DIAGNOSIS — Z0000 Encounter for general adult medical examination without abnormal findings: Secondary | ICD-10-CM | POA: Diagnosis not present

## 2019-08-24 DIAGNOSIS — Z0184 Encounter for antibody response examination: Secondary | ICD-10-CM | POA: Diagnosis not present

## 2019-09-02 DIAGNOSIS — U071 COVID-19: Secondary | ICD-10-CM | POA: Diagnosis not present

## 2019-09-10 DIAGNOSIS — Z20822 Contact with and (suspected) exposure to covid-19: Secondary | ICD-10-CM | POA: Diagnosis not present

## 2019-10-24 DIAGNOSIS — G4700 Insomnia, unspecified: Secondary | ICD-10-CM | POA: Diagnosis not present

## 2019-10-24 DIAGNOSIS — F411 Generalized anxiety disorder: Secondary | ICD-10-CM | POA: Diagnosis not present

## 2019-10-24 DIAGNOSIS — F331 Major depressive disorder, recurrent, moderate: Secondary | ICD-10-CM | POA: Diagnosis not present

## 2019-11-09 DIAGNOSIS — Z1159 Encounter for screening for other viral diseases: Secondary | ICD-10-CM | POA: Diagnosis not present

## 2019-11-09 DIAGNOSIS — E785 Hyperlipidemia, unspecified: Secondary | ICD-10-CM | POA: Diagnosis not present

## 2019-11-09 DIAGNOSIS — Z0000 Encounter for general adult medical examination without abnormal findings: Secondary | ICD-10-CM | POA: Diagnosis not present

## 2019-11-11 DIAGNOSIS — Z0000 Encounter for general adult medical examination without abnormal findings: Secondary | ICD-10-CM | POA: Diagnosis not present

## 2019-11-30 DIAGNOSIS — F3181 Bipolar II disorder: Secondary | ICD-10-CM | POA: Diagnosis not present

## 2019-12-13 DIAGNOSIS — F3181 Bipolar II disorder: Secondary | ICD-10-CM | POA: Diagnosis not present

## 2019-12-27 DIAGNOSIS — F3181 Bipolar II disorder: Secondary | ICD-10-CM | POA: Diagnosis not present

## 2020-01-11 DIAGNOSIS — F331 Major depressive disorder, recurrent, moderate: Secondary | ICD-10-CM | POA: Diagnosis not present

## 2020-01-24 DIAGNOSIS — F411 Generalized anxiety disorder: Secondary | ICD-10-CM | POA: Diagnosis not present

## 2020-01-24 DIAGNOSIS — F331 Major depressive disorder, recurrent, moderate: Secondary | ICD-10-CM | POA: Diagnosis not present

## 2020-02-22 DIAGNOSIS — U099 Post covid-19 condition, unspecified: Secondary | ICD-10-CM | POA: Diagnosis not present

## 2020-02-22 DIAGNOSIS — R439 Unspecified disturbances of smell and taste: Secondary | ICD-10-CM | POA: Diagnosis not present

## 2020-02-22 DIAGNOSIS — Z7189 Other specified counseling: Secondary | ICD-10-CM | POA: Diagnosis not present

## 2020-02-22 DIAGNOSIS — U071 COVID-19: Secondary | ICD-10-CM | POA: Diagnosis not present

## 2020-02-22 DIAGNOSIS — F411 Generalized anxiety disorder: Secondary | ICD-10-CM | POA: Diagnosis not present

## 2020-02-28 DIAGNOSIS — F331 Major depressive disorder, recurrent, moderate: Secondary | ICD-10-CM | POA: Diagnosis not present

## 2020-02-28 DIAGNOSIS — F411 Generalized anxiety disorder: Secondary | ICD-10-CM | POA: Diagnosis not present

## 2020-04-11 DIAGNOSIS — F331 Major depressive disorder, recurrent, moderate: Secondary | ICD-10-CM | POA: Diagnosis not present

## 2020-04-11 DIAGNOSIS — F411 Generalized anxiety disorder: Secondary | ICD-10-CM | POA: Diagnosis not present

## 2020-05-02 DIAGNOSIS — F411 Generalized anxiety disorder: Secondary | ICD-10-CM | POA: Diagnosis not present

## 2020-05-02 DIAGNOSIS — F331 Major depressive disorder, recurrent, moderate: Secondary | ICD-10-CM | POA: Diagnosis not present

## 2020-05-27 DIAGNOSIS — Z13220 Encounter for screening for lipoid disorders: Secondary | ICD-10-CM | POA: Diagnosis not present

## 2020-05-27 DIAGNOSIS — Z713 Dietary counseling and surveillance: Secondary | ICD-10-CM | POA: Diagnosis not present

## 2020-05-27 DIAGNOSIS — Z6829 Body mass index (BMI) 29.0-29.9, adult: Secondary | ICD-10-CM | POA: Diagnosis not present

## 2020-05-27 DIAGNOSIS — Z136 Encounter for screening for cardiovascular disorders: Secondary | ICD-10-CM | POA: Diagnosis not present

## 2020-06-13 DIAGNOSIS — F331 Major depressive disorder, recurrent, moderate: Secondary | ICD-10-CM | POA: Diagnosis not present

## 2020-06-13 DIAGNOSIS — F411 Generalized anxiety disorder: Secondary | ICD-10-CM | POA: Diagnosis not present

## 2020-07-11 DIAGNOSIS — F4323 Adjustment disorder with mixed anxiety and depressed mood: Secondary | ICD-10-CM | POA: Diagnosis not present

## 2020-07-11 DIAGNOSIS — F331 Major depressive disorder, recurrent, moderate: Secondary | ICD-10-CM | POA: Diagnosis not present

## 2020-07-11 DIAGNOSIS — F411 Generalized anxiety disorder: Secondary | ICD-10-CM | POA: Diagnosis not present

## 2020-08-07 DIAGNOSIS — F331 Major depressive disorder, recurrent, moderate: Secondary | ICD-10-CM | POA: Diagnosis not present

## 2020-08-07 DIAGNOSIS — F411 Generalized anxiety disorder: Secondary | ICD-10-CM | POA: Diagnosis not present

## 2020-08-07 DIAGNOSIS — F4323 Adjustment disorder with mixed anxiety and depressed mood: Secondary | ICD-10-CM | POA: Diagnosis not present

## 2020-08-08 DIAGNOSIS — F411 Generalized anxiety disorder: Secondary | ICD-10-CM | POA: Diagnosis not present

## 2020-08-15 DIAGNOSIS — F331 Major depressive disorder, recurrent, moderate: Secondary | ICD-10-CM | POA: Diagnosis not present

## 2020-08-15 DIAGNOSIS — G4700 Insomnia, unspecified: Secondary | ICD-10-CM | POA: Diagnosis not present

## 2020-08-15 DIAGNOSIS — F411 Generalized anxiety disorder: Secondary | ICD-10-CM | POA: Diagnosis not present

## 2020-08-22 DIAGNOSIS — F411 Generalized anxiety disorder: Secondary | ICD-10-CM | POA: Diagnosis not present

## 2020-08-30 DIAGNOSIS — F331 Major depressive disorder, recurrent, moderate: Secondary | ICD-10-CM | POA: Diagnosis not present

## 2020-08-30 DIAGNOSIS — F411 Generalized anxiety disorder: Secondary | ICD-10-CM | POA: Diagnosis not present

## 2020-08-30 DIAGNOSIS — F4323 Adjustment disorder with mixed anxiety and depressed mood: Secondary | ICD-10-CM | POA: Diagnosis not present

## 2020-09-03 DIAGNOSIS — F411 Generalized anxiety disorder: Secondary | ICD-10-CM | POA: Diagnosis not present

## 2020-09-11 DIAGNOSIS — F411 Generalized anxiety disorder: Secondary | ICD-10-CM | POA: Diagnosis not present

## 2020-09-12 DIAGNOSIS — F4323 Adjustment disorder with mixed anxiety and depressed mood: Secondary | ICD-10-CM | POA: Diagnosis not present

## 2020-09-12 DIAGNOSIS — F411 Generalized anxiety disorder: Secondary | ICD-10-CM | POA: Diagnosis not present

## 2020-09-12 DIAGNOSIS — F331 Major depressive disorder, recurrent, moderate: Secondary | ICD-10-CM | POA: Diagnosis not present

## 2020-09-17 DIAGNOSIS — F411 Generalized anxiety disorder: Secondary | ICD-10-CM | POA: Diagnosis not present

## 2020-09-18 ENCOUNTER — Telehealth (HOSPITAL_COMMUNITY): Payer: Self-pay | Admitting: Licensed Clinical Social Worker

## 2020-09-20 ENCOUNTER — Telehealth (HOSPITAL_COMMUNITY): Payer: Self-pay | Admitting: Licensed Clinical Social Worker

## 2020-09-21 ENCOUNTER — Telehealth (HOSPITAL_COMMUNITY): Payer: Self-pay | Admitting: Psychiatry

## 2020-09-21 NOTE — Telephone Encounter (Signed)
D:  Laury Deep, NP referred pt to [facility].  A:  Placed call to pt, but there was no answer.  Left vm for pt to call case manager back.

## 2020-09-24 DIAGNOSIS — F411 Generalized anxiety disorder: Secondary | ICD-10-CM | POA: Diagnosis not present

## 2020-10-02 DIAGNOSIS — F411 Generalized anxiety disorder: Secondary | ICD-10-CM | POA: Diagnosis not present

## 2020-10-08 DIAGNOSIS — F411 Generalized anxiety disorder: Secondary | ICD-10-CM | POA: Diagnosis not present

## 2020-10-15 DIAGNOSIS — F411 Generalized anxiety disorder: Secondary | ICD-10-CM | POA: Diagnosis not present

## 2020-10-22 DIAGNOSIS — F411 Generalized anxiety disorder: Secondary | ICD-10-CM | POA: Diagnosis not present

## 2020-12-19 DIAGNOSIS — Z0000 Encounter for general adult medical examination without abnormal findings: Secondary | ICD-10-CM | POA: Diagnosis not present

## 2020-12-27 DIAGNOSIS — I10 Essential (primary) hypertension: Secondary | ICD-10-CM | POA: Diagnosis not present

## 2020-12-27 DIAGNOSIS — Z2821 Immunization not carried out because of patient refusal: Secondary | ICD-10-CM | POA: Diagnosis not present

## 2020-12-27 DIAGNOSIS — Z7189 Other specified counseling: Secondary | ICD-10-CM | POA: Diagnosis not present

## 2020-12-27 DIAGNOSIS — Z0000 Encounter for general adult medical examination without abnormal findings: Secondary | ICD-10-CM | POA: Diagnosis not present

## 2020-12-27 DIAGNOSIS — E785 Hyperlipidemia, unspecified: Secondary | ICD-10-CM | POA: Diagnosis not present

## 2020-12-27 DIAGNOSIS — Z1211 Encounter for screening for malignant neoplasm of colon: Secondary | ICD-10-CM | POA: Diagnosis not present

## 2020-12-27 DIAGNOSIS — Z1159 Encounter for screening for other viral diseases: Secondary | ICD-10-CM | POA: Diagnosis not present

## 2021-01-07 ENCOUNTER — Other Ambulatory Visit: Payer: Self-pay | Admitting: Family Medicine

## 2021-01-07 DIAGNOSIS — Z1231 Encounter for screening mammogram for malignant neoplasm of breast: Secondary | ICD-10-CM

## 2021-02-20 ENCOUNTER — Ambulatory Visit: Payer: BLUE CROSS/BLUE SHIELD

## 2022-01-07 ENCOUNTER — Other Ambulatory Visit: Payer: Self-pay | Admitting: Family Medicine

## 2022-01-07 DIAGNOSIS — Z1231 Encounter for screening mammogram for malignant neoplasm of breast: Secondary | ICD-10-CM

## 2022-01-20 ENCOUNTER — Other Ambulatory Visit: Payer: Self-pay | Admitting: Family Medicine

## 2022-01-20 DIAGNOSIS — K8010 Calculus of gallbladder with chronic cholecystitis without obstruction: Secondary | ICD-10-CM

## 2022-02-07 ENCOUNTER — Ambulatory Visit: Payer: Self-pay

## 2022-02-07 ENCOUNTER — Ambulatory Visit
Admission: RE | Admit: 2022-02-07 | Discharge: 2022-02-07 | Disposition: A | Discharge status: Home / Self Care | Payer: Commercial Managed Care - HMO | Source: Ambulatory Visit | Attending: Family Medicine | Admitting: Family Medicine

## 2022-02-07 DIAGNOSIS — Z1231 Encounter for screening mammogram for malignant neoplasm of breast: Secondary | ICD-10-CM

## 2022-02-24 ENCOUNTER — Ambulatory Visit
Admission: RE | Admit: 2022-02-24 | Discharge: 2022-02-24 | Disposition: A | Discharge status: Home / Self Care | Payer: Commercial Managed Care - HMO | Source: Ambulatory Visit | Attending: Family Medicine | Admitting: Family Medicine

## 2022-02-24 DIAGNOSIS — K8010 Calculus of gallbladder with chronic cholecystitis without obstruction: Secondary | ICD-10-CM

## 2022-03-04 ENCOUNTER — Ambulatory Visit: Payer: Self-pay

## 2023-11-23 ENCOUNTER — Observation Stay (HOSPITAL_BASED_OUTPATIENT_CLINIC_OR_DEPARTMENT_OTHER)
Admission: EM | Admit: 2023-11-23 | Discharge: 2023-11-25 | Disposition: A | Discharge status: Home / Self Care | Attending: General Surgery | Admitting: General Surgery

## 2023-11-23 ENCOUNTER — Encounter (HOSPITAL_BASED_OUTPATIENT_CLINIC_OR_DEPARTMENT_OTHER): Payer: Self-pay | Admitting: Radiology

## 2023-11-23 ENCOUNTER — Other Ambulatory Visit: Payer: Self-pay

## 2023-11-23 ENCOUNTER — Emergency Department (HOSPITAL_BASED_OUTPATIENT_CLINIC_OR_DEPARTMENT_OTHER)

## 2023-11-23 DIAGNOSIS — G4700 Insomnia, unspecified: Secondary | ICD-10-CM | POA: Insufficient documentation

## 2023-11-23 DIAGNOSIS — K8050 Calculus of bile duct without cholangitis or cholecystitis without obstruction: Secondary | ICD-10-CM | POA: Diagnosis present

## 2023-11-23 DIAGNOSIS — F1090 Alcohol use, unspecified, uncomplicated: Secondary | ICD-10-CM | POA: Diagnosis not present

## 2023-11-23 DIAGNOSIS — R1011 Right upper quadrant pain: Secondary | ICD-10-CM | POA: Diagnosis not present

## 2023-11-23 DIAGNOSIS — K8010 Calculus of gallbladder with chronic cholecystitis without obstruction: Secondary | ICD-10-CM | POA: Diagnosis not present

## 2023-11-23 DIAGNOSIS — M2550 Pain in unspecified joint: Secondary | ICD-10-CM | POA: Insufficient documentation

## 2023-11-23 DIAGNOSIS — E785 Hyperlipidemia, unspecified: Secondary | ICD-10-CM | POA: Diagnosis not present

## 2023-11-23 DIAGNOSIS — K219 Gastro-esophageal reflux disease without esophagitis: Secondary | ICD-10-CM | POA: Diagnosis not present

## 2023-11-23 DIAGNOSIS — F419 Anxiety disorder, unspecified: Secondary | ICD-10-CM | POA: Diagnosis not present

## 2023-11-23 DIAGNOSIS — Z87891 Personal history of nicotine dependence: Secondary | ICD-10-CM | POA: Diagnosis not present

## 2023-11-23 DIAGNOSIS — K8080 Other cholelithiasis without obstruction: Secondary | ICD-10-CM | POA: Diagnosis not present

## 2023-11-23 LAB — COMPREHENSIVE METABOLIC PANEL WITH GFR
ALT: 19 U/L (ref 0–44)
AST: 22 U/L (ref 15–41)
Albumin: 5.2 g/dL — ABNORMAL HIGH (ref 3.5–5.0)
Alkaline Phosphatase: 85 U/L (ref 38–126)
Anion gap: 14 (ref 5–15)
BUN: 8 mg/dL (ref 8–23)
CO2: 23 mmol/L (ref 22–32)
Calcium: 9.7 mg/dL (ref 8.9–10.3)
Chloride: 102 mmol/L (ref 98–111)
Creatinine, Ser: 0.72 mg/dL (ref 0.44–1.00)
GFR, Estimated: >60 mL/min (ref >60)
Glucose, Bld: 156 mg/dL — ABNORMAL HIGH (ref 70–99)
Potassium: 4 mmol/L (ref 3.5–5.1)
Sodium: 139 mmol/L (ref 135–145)
Total Bilirubin: 0.4 mg/dL (ref 0.0–1.2)
Total Protein: 8.3 g/dL — ABNORMAL HIGH (ref 6.5–8.1)

## 2023-11-23 LAB — URINALYSIS, ROUTINE W REFLEX MICROSCOPIC
Bilirubin Urine: NEGATIVE
Glucose, UA: NEGATIVE mg/dL
Ketones, ur: NEGATIVE mg/dL
Leukocytes,Ua: NEGATIVE
Nitrite: NEGATIVE
Protein, ur: NEGATIVE mg/dL
Specific Gravity, Urine: 1.02 (ref 1.005–1.030)
pH: 8 (ref 5.0–8.0)

## 2023-11-23 LAB — CBC
HCT: 38.3 % (ref 36.0–46.0)
Hemoglobin: 12.7 g/dL (ref 12.0–15.0)
MCH: 29.1 pg (ref 26.0–34.0)
MCHC: 33.2 g/dL (ref 30.0–36.0)
MCV: 87.6 fL (ref 80.0–100.0)
Platelets: 262 K/uL (ref 150–400)
RBC: 4.37 MIL/uL (ref 3.87–5.11)
RDW: 13.4 % (ref 11.5–15.5)
WBC: 10.8 K/uL — ABNORMAL HIGH (ref 4.0–10.5)
nRBC: 0 % (ref 0.0–0.2)

## 2023-11-23 LAB — DIFFERENTIAL
Abs Immature Granulocytes: 0.04 K/uL (ref 0.00–0.07)
Basophils Absolute: 0 K/uL (ref 0.0–0.1)
Basophils Relative: 0 %
Eosinophils Absolute: 0 K/uL (ref 0.0–0.5)
Eosinophils Relative: 0 %
Immature Granulocytes: 0 %
Lymphocytes Relative: 9 %
Lymphs Abs: 0.9 K/uL (ref 0.7–4.0)
Monocytes Absolute: 0.4 K/uL (ref 0.1–1.0)
Monocytes Relative: 4 %
Neutro Abs: 9.4 K/uL — ABNORMAL HIGH (ref 1.7–7.7)
Neutrophils Relative %: 87 %

## 2023-11-23 LAB — URINALYSIS, MICROSCOPIC (REFLEX)

## 2023-11-23 LAB — SURGICAL PCR SCREEN
MRSA, PCR: NEGATIVE
Staphylococcus aureus: NEGATIVE

## 2023-11-23 LAB — HIV ANTIBODY (ROUTINE TESTING W REFLEX): HIV Screen 4th Generation wRfx: NONREACTIVE

## 2023-11-23 LAB — LIPASE, BLOOD: Lipase: 31 U/L (ref 11–51)

## 2023-11-23 MED ORDER — HYDRALAZINE HCL 20 MG/ML IJ SOLN: 10.0000 mg | INTRAMUSCULAR | Status: DC | PRN

## 2023-11-23 MED ORDER — IOHEXOL 300 MG/ML  SOLN
100.0000 mL | Freq: Once | INTRAMUSCULAR | Status: AC | PRN
  Administered 2023-11-23: 100 mL via INTRAVENOUS

## 2023-11-23 MED ORDER — HYDROMORPHONE HCL 1 MG/ML IJ SOLN
0.5000 mg | Freq: Once | INTRAMUSCULAR | Status: AC
  Administered 2023-11-23: 0.5 mg via INTRAVENOUS
  Filled 2023-11-23: qty 1

## 2023-11-23 MED ORDER — DIPHENHYDRAMINE HCL 12.5 MG/5ML PO ELIX: 12.5000 mg | ORAL_SOLUTION | Freq: Four times a day (QID) | ORAL | Status: DC | PRN

## 2023-11-23 MED ORDER — ONDANSETRON HCL 4 MG/2ML IJ SOLN
4.0000 mg | Freq: Once | INTRAMUSCULAR | Status: AC
  Administered 2023-11-23: 4 mg via INTRAVENOUS
  Filled 2023-11-23: qty 2

## 2023-11-23 MED ORDER — DIPHENHYDRAMINE HCL 50 MG/ML IJ SOLN: 12.5000 mg | Freq: Four times a day (QID) | INTRAMUSCULAR | Status: DC | PRN

## 2023-11-23 MED ORDER — ACETAMINOPHEN 500 MG PO TABS
1000.0000 mg | ORAL_TABLET | Freq: Four times a day (QID) | ORAL | Status: DC
  Administered 2023-11-24 – 2023-11-25 (×6): 1000 mg via ORAL
  Filled 2023-11-23 (×6): qty 2

## 2023-11-23 MED ORDER — METOPROLOL TARTRATE 5 MG/5ML IV SOLN: 5.0000 mg | Freq: Four times a day (QID) | INTRAVENOUS | Status: DC | PRN

## 2023-11-23 MED ORDER — METOCLOPRAMIDE HCL 10 MG PO TABS
5.0000 mg | ORAL_TABLET | Freq: Once | ORAL | Status: AC
  Administered 2023-11-23: 5 mg via ORAL
  Filled 2023-11-23: qty 1

## 2023-11-23 MED ORDER — SODIUM CHLORIDE 0.9 % IV SOLN
2.0000 g | INTRAVENOUS | Status: DC
  Administered 2023-11-23: 2 g via INTRAVENOUS
  Filled 2023-11-23: qty 20

## 2023-11-23 MED ORDER — SIMETHICONE 80 MG PO CHEW
40.0000 mg | CHEWABLE_TABLET | Freq: Four times a day (QID) | ORAL | Status: DC | PRN
  Administered 2023-11-24 – 2023-11-25 (×2): 40 mg via ORAL
  Filled 2023-11-23 (×2): qty 1

## 2023-11-23 MED ORDER — HYDROMORPHONE HCL 1 MG/ML IJ SOLN
0.5000 mg | INTRAMUSCULAR | Status: DC | PRN
  Administered 2023-11-23 – 2023-11-24 (×3): 1 mg via INTRAVENOUS
  Filled 2023-11-23 (×3): qty 1

## 2023-11-23 MED ORDER — MELATONIN 3 MG PO TABS: 3.0000 mg | ORAL_TABLET | Freq: Every evening | ORAL | Status: DC | PRN

## 2023-11-23 MED ORDER — POLYETHYLENE GLYCOL 3350 17 G PO PACK: 17.0000 g | PACK | Freq: Every day | ORAL | Status: DC | PRN

## 2023-11-23 MED ORDER — OXYCODONE HCL 5 MG PO TABS
5.0000 mg | ORAL_TABLET | ORAL | Status: DC | PRN
  Administered 2023-11-24 – 2023-11-25 (×4): 5 mg via ORAL
  Filled 2023-11-23 (×2): qty 1
  Filled 2023-11-23: qty 2
  Filled 2023-11-23: qty 1

## 2023-11-23 MED ORDER — ONDANSETRON HCL 4 MG/2ML IJ SOLN
4.0000 mg | Freq: Four times a day (QID) | INTRAMUSCULAR | Status: DC | PRN
  Administered 2023-11-23: 4 mg via INTRAVENOUS
  Filled 2023-11-23: qty 2

## 2023-11-23 MED ORDER — KCL IN DEXTROSE-NACL 20-5-0.45 MEQ/L-%-% IV SOLN
INTRAVENOUS | Status: AC
  Filled 2023-11-23 (×2): qty 1000

## 2023-11-23 MED ORDER — MONTELUKAST SODIUM 10 MG PO TABS
10.0000 mg | ORAL_TABLET | Freq: Every day | ORAL | Status: DC
  Administered 2023-11-23 – 2023-11-24 (×2): 10 mg via ORAL
  Filled 2023-11-23 (×2): qty 1

## 2023-11-23 MED ORDER — ONDANSETRON 4 MG PO TBDP
4.0000 mg | ORAL_TABLET | Freq: Four times a day (QID) | ORAL | Status: DC | PRN
Start: 2023-11-23 — End: 2023-11-25
  Administered 2023-11-24: 4 mg via ORAL
  Filled 2023-11-23: qty 1

## 2023-11-23 MED ORDER — LACTATED RINGERS IV BOLUS
1000.0000 mL | Freq: Once | INTRAVENOUS | Status: AC
  Administered 2023-11-23: 1000 mL via INTRAVENOUS

## 2023-11-23 MED ORDER — ENOXAPARIN SODIUM 40 MG/0.4ML IJ SOSY
40.0000 mg | PREFILLED_SYRINGE | INTRAMUSCULAR | Status: DC
  Administered 2023-11-23: 40 mg via SUBCUTANEOUS
  Filled 2023-11-23: qty 0.4

## 2023-11-23 MED ORDER — SODIUM CHLORIDE 0.9 % IV SOLN: 2.0000 g | INTRAVENOUS | Status: DC

## 2023-11-23 NOTE — H&P (Signed)
 H&P Note  Gina Velez May 15, 1960  [id number].    Requesting MD: Caron Salt, DO Chief Complaint/Reason for Consult: Biliary colic   HPI:  Patient is a 63 year old female who presented to [facility] ED with epigastric and RUQ abdominal pain that started around 2 AM. Pain was acute onset and woke her from sleep. Pain has been constant overall but waxing/waning in intensity. Known hx of gallstones and intermittent episodes for 10 yrs.  She has known she needed her gallbladder out but was scared to have surgery. She did have associated nausea and vomiting. PMH otherwise significant for HLD, GERD, Anxiety, Insomnia  and Chronic joint pain. Prior abdominal surgery includes partial abdominal hysterectomy. Not on blood thinners and NKDA.   In the ED, she has been found to have normal LFTs and a WBC of 10K.  Her US  shows stones, but no wall thickening or signs of cholecystitis.  She has continued to have pain despite medications though.  We have been asked to evaluate her for further recommendations.  ROS: ROS: see HPI  Family History  Problem Relation Age of Onset   Colon cancer Neg Hx    Colon polyps Neg Hx    Esophageal cancer Neg Hx    Stomach cancer Neg Hx    Rectal cancer Neg Hx     Past Medical History:  Diagnosis Date   Allergy    Anemia    was on meds but has been off x 49yrs ago   Anxiety    takes Klonopin nightly and Lexapro daily   Chronic joint pain    GERD (gastroesophageal reflux disease)    History of bronchitis    about 70yrs ago   Hyperlipidemia    but no meds needed at present borderline   Insomnia    doesn't take any meds   Muscle spasm of back    takes Zanaflex daily as needed   Nocturia    Seasonal allergies    takes Singulair  nightly and FLonase at bedtime   Seizures (HCC)    30+yrs ago and no meds required(while giving birth)   Urinary frequency    Urinary urgency    Weakness    numbness and tingling    Past Surgical History:  Procedure  Laterality Date   ABDOMINAL HYSTERECTOMY     SACROILIAC JOINT FUSION Right 05/11/2013   Procedure: SACROILIAC JOINT FUSION;  Surgeon: Oneil Rodgers Priestly, MD;  Location: [facility] OR;  Service: Orthopedics;  Laterality: Right;  Right sided sacroiliac joint fusion   TONSILLECTOMY      Social History:  reports that she quit smoking about 26 years ago. She has never used smokeless tobacco. She reports current alcohol use. She reports that she does not use drugs.  Allergies: No Known Allergies  Medications Prior to Admission  Medication Sig Dispense Refill   fluticasone (FLONASE) 50 MCG/ACT nasal spray Place 1 spray into both nostrils at bedtime.     MAGNESIUM PO Take by mouth.     MELATONIN PO Take by mouth.     montelukast  (SINGULAIR ) 10 MG tablet Take 10 mg by mouth at bedtime.     Multiple Vitamin (MULTIVITAMIN) tablet Take 1 tablet by mouth daily.     PRESCRIPTION MEDICATION      UNABLE TO FIND CBD capsule twice daily      Blood pressure (!) 175/70, pulse 61, temperature 98 F (36.7 C), temperature source Oral, resp. rate 16, height 5' 5 (1.651 m),  weight 79.4 kg, SpO2 100%. Physical Exam: General: pleasant, WD, WN black female who is laying in bed in NAD HEENT: head is normocephalic, atraumatic.  Sclera are noninjected.  PERRL.  Ears and nose without any masses or lesions.  Mouth is pink and dry Heart: regular, rate, and rhythm.  Normal s1,s2. No obvious murmurs, gallops, or rubs noted.   Lungs: CTAB, no wheezes, rhonchi, or rales noted.  Respiratory effort nonlabored Abd: soft, tender in epigastrium, but greatest in RUQ with + Murphy's sign, ND, +BS, no masses, hernias, or organomegaly Psych: A&Ox3 with an appropriate affect.   Results for orders placed or performed during the hospital encounter of 11/23/23 (from the past 48 hours)  Lipase, blood     Status: None   Collection Time: 11/23/23 10:25 AM  Result Value Ref Range   Lipase 31 11 - 51 U/L    Comment: Performed at [facility], [address] [place], [place] [address]  Comprehensive metabolic panel     Status: Abnormal   Collection Time: 11/23/23 10:25 AM  Result Value Ref Range   Sodium 139 135 - 145 mmol/L   Potassium 4.0 3.5 - 5.1 mmol/L   Chloride 102 98 - 111 mmol/L   CO2 23 22 - 32 mmol/L   Glucose, Bld 156 (H) 70 - 99 mg/dL    Comment: Glucose reference range applies only to samples taken after fasting for at least 8 hours.   BUN 8 8 - 23 mg/dL   Creatinine, Ser 9.27 0.44 - 1.00 mg/dL   Calcium 9.7 8.9 - 89.6 mg/dL   Total Protein 8.3 (H) 6.5 - 8.1 g/dL   Albumin 5.2 (H) 3.5 - 5.0 g/dL   AST 22 15 - 41 U/L   ALT 19 0 - 44 U/L   Alkaline Phosphatase 85 38 - 126 U/L   Total Bilirubin 0.4 0.0 - 1.2 mg/dL   GFR, Estimated >39 >39 mL/min    Comment: (NOTE) Calculated using the CKD-EPI Creatinine Equation (2021)    Anion gap 14 5 - 15    Comment: Performed at [facility], [address] [place], [place] [address]  Urinalysis, Routine w reflex microscopic -Urine, Clean Catch     Status: Abnormal   Collection Time: 11/23/23 10:25 AM  Result Value Ref Range   Color, Urine YELLOW YELLOW   APPearance CLEAR CLEAR   Specific Gravity, Urine 1.020 1.005 - 1.030   pH 8.0 5.0 - 8.0   Glucose, UA NEGATIVE NEGATIVE mg/dL   Hgb urine dipstick TRACE (A) NEGATIVE   Bilirubin Urine NEGATIVE NEGATIVE   Ketones, ur NEGATIVE NEGATIVE mg/dL   Protein, ur NEGATIVE NEGATIVE mg/dL   Nitrite NEGATIVE NEGATIVE   Leukocytes,Ua NEGATIVE NEGATIVE    Comment: Performed at [facility], [address] [place], [place] [address]  Urinalysis, Microscopic (reflex)     Status: Abnormal   Collection Time: 11/23/23 10:25 AM  Result Value Ref Range   RBC / HPF 0-5 0 - 5 RBC/hpf   WBC, UA 0-5 0 - 5 WBC/hpf   Bacteria, UA RARE (A) NONE SEEN   Squamous Epithelial / HPF 0-5 0 - 5 /HPF    Comment: Performed at [facility], [address] [place], [place] [address]  CBC      Status: Abnormal   Collection Time: 11/23/23  1:00 PM  Result Value Ref Range   WBC 10.8 (H) 4.0 - 10.5  K/uL   RBC 4.37 3.87 - 5.11 MIL/uL   Hemoglobin 12.7 12.0 - 15.0 g/dL   HCT 61.6 63.9 - 53.9 %   MCV 87.6 80.0 - 100.0 fL   MCH 29.1 26.0 - 34.0 pg   MCHC 33.2 30.0 - 36.0 g/dL   RDW 86.5 88.4 - 84.4 %   Platelets 262 150 - 400 K/uL   nRBC 0.0 0.0 - 0.2 %    Comment: Performed at [facility], [address] [place], [place] [address]  Differential     Status: Abnormal   Collection Time: 11/23/23  1:00 PM  Result Value Ref Range   Neutrophils Relative % 87 %   Neutro Abs 9.4 (H) 1.7 - 7.7 K/uL   Lymphocytes Relative 9 %   Lymphs Abs 0.9 0.7 - 4.0 K/uL   Monocytes Relative 4 %   Monocytes Absolute 0.4 0.1 - 1.0 K/uL   Eosinophils Relative 0 %   Eosinophils Absolute 0.0 0.0 - 0.5 K/uL   Basophils Relative 0 %   Basophils Absolute 0.0 0.0 - 0.1 K/uL   Immature Granulocytes 0 %   Abs Immature Granulocytes 0.04 0.00 - 0.07 K/uL    Comment: Performed at [facility], [address] [place], [place] [address]   CT ABDOMEN PELVIS W CONTRAST Result Date: 11/23/2023 CLINICAL DATA:  Upper abdominal pain., nausea, vomiting EXAM: CT ABDOMEN AND PELVIS WITH CONTRAST TECHNIQUE: Multidetector CT imaging of the abdomen and pelvis was performed using the standard protocol following bolus administration of intravenous contrast. RADIATION DOSE REDUCTION: This exam was performed according to the departmental dose-optimization program which includes automated exposure control, adjustment of the mA and/or kV according to patient size and/or use of iterative reconstruction technique. CONTRAST:  OMNIPAQUE  IOHEXOL  300 MG/ML  SOLN COMPARISON:  Ultrasound today. FINDINGS: Lower chest: Dependent atelectasis.  No effusions. Hepatobiliary: Gallbladder is distended with multiple stones. No evidence of wall thickening, pericholecystic fluid or biliary ductal dilatation. No focal  hepatic abnormality. Pancreas: No focal abnormality or ductal dilatation. Spleen: No focal abnormality.  Normal size. Adrenals/Urinary Tract: No adrenal abnormality. No focal renal abnormality. No stones or hydronephrosis. Urinary bladder is unremarkable. Stomach/Bowel: Stomach, large and small bowel grossly unremarkable. No obstruction or inflammatory process. Vascular/Lymphatic: Aortic atherosclerosis. No evidence of aneurysm or adenopathy. Reproductive: Prior hysterectomy.  No adnexal masses. Other: No free fluid or free air. Musculoskeletal: No acute bony abnormality. IMPRESSION: Cholelithiasis.  No CT evidence of acute cholecystitis. Electronically Signed   By: Franky Crease M.D.   On: 11/23/2023 12:39   US  Abdomen Limited RUQ (LIVER/GB) Result Date: 11/23/2023 CLINICAL DATA:  Right upper quadrant pain, gallstones EXAM: ULTRASOUND ABDOMEN LIMITED RIGHT UPPER QUADRANT COMPARISON:  02/24/2022 FINDINGS: Gallbladder: Multiple gallstones measuring up to 1.7 cm. No wall thickening or sonographic Murphy sign. Common bile duct: Diameter: Normal caliber, 4 mm Liver: No focal lesion identified. Within normal limits in parenchymal echogenicity. Portal vein is patent on color Doppler imaging with normal direction of blood flow towards the liver. Other: None. IMPRESSION: Cholelithiasis.  No sonographic evidence of acute cholecystitis. Electronically Signed   By: Franky Crease M.D.   On: 11/23/2023 12:37      Assessment/Plan Biliary colic, early cholecystitis Patient seen, examined, labs, vitals, chart, and imaging personally reviewed - RUQ US  and CT today with cholelithiasis, no cholecystitis, but clinically the patient is quite tender.  Will go ahead and start on Rocephin  for possible early cholecystitis.  - mild leukocytosis  10.8, afebrile and HD stable  - no LFT or Tbili elevation  - recommend admission to observation and OR for laparoscopic cholecystectomy tomorrow AM - repeat labs in AM - ok to have CLD  until MN but will make NPO after MN -I have explained the procedure, risks, and aftercare of cholecystectomy.  Risks include but are not limited to bleeding, infection, wound problems, anesthesia, diarrhea, bile leak, injury to common bile duct/liver/intestine.  She seems to understand and agrees to proceed. -daughter was present in the room and other daughter on FaceTIme.  All heard explanation of procedure, plan, and post op recommendations.   FEN: CLD, NPO p MN, IVFs VTE: lovenox  ID: Rocephin  Dispo - obs, med-sur, OR tomorrow  HLD GERD Anxiety Insomnia  Chronic joint pain  I reviewed ED provider notes, last 24 h vitals and pain scores, last 48 h intake and output, last 24 h labs and trends, and last 24 h imaging results.  Burnard FORBES Banter, [facility] 11/23/2023, 3:47 PM Please see Amion for pager number during day hours 7:00am-4:30pm

## 2023-11-23 NOTE — ED Provider Notes (Signed)
 [place] [facility] Provider Note   CSN: [id number] Arrival date & time: 11/23/23  1008     Patient presents with: Abdominal Pain   Gina Velez is a 63 y.o. female.   63 year old female presenting emergency department for epigastric and right upper quadrant abdominal pain.  Started at 2 AM this morning abruptly and woke her from her sleep.  Associated nausea vomiting.  Has been constant, but varying in intensity since that time.  Had a bowel movement this morning.  Reports history of gallstones.  Has had partial hysterectomy, but no other abdominal surgeries.   Abdominal Pain      Prior to Admission medications   Medication Sig Start Date End Date Taking? Authorizing Provider  fluticasone (FLONASE) 50 MCG/ACT nasal spray Place 1 spray into both nostrils at bedtime.    [provider]  MAGNESIUM PO Take by mouth.    [provider]  MELATONIN PO Take by mouth.    [provider]  montelukast  (SINGULAIR ) 10 MG tablet Take 10 mg by mouth at bedtime.    [provider]  Multiple Vitamin (MULTIVITAMIN) tablet Take 1 tablet by mouth daily.    [provider]  PRESCRIPTION MEDICATION     [provider]  UNABLE TO FIND CBD capsule twice daily    [provider]    Allergies: Patient has no known allergies.    Review of Systems  Gastrointestinal:  Positive for abdominal pain.    Updated Vital Signs BP (!) 176/90 (BP Location: Right Arm)   Pulse 62   Temp 98.2 F (36.8 C) (Oral)   Resp 18   Ht 5' 5 (1.651 m)   Wt 79.4 kg   SpO2 99%   BMI 29.13 kg/m   Physical Exam Vitals and nursing note reviewed.  Constitutional:      General: She is not in acute distress.    Appearance: She is not toxic-appearing.  HENT:     Head: Normocephalic.  Cardiovascular:     Rate and Rhythm: Normal rate.  Pulmonary:     Effort: Pulmonary effort is normal.     Breath sounds: Normal  breath sounds.  Abdominal:     General: Abdomen is flat.     Palpations: Abdomen is soft.     Tenderness: There is abdominal tenderness in the right upper quadrant and epigastric area. There is no guarding or rebound.  Skin:    General: Skin is warm and dry.     Capillary Refill: Capillary refill takes less than 2 seconds.  Neurological:     General: No focal deficit present.     Mental Status: She is alert.  Psychiatric:        Mood and Affect: Mood normal.        Behavior: Behavior normal.     (all labs ordered are listed, but only abnormal results are displayed) Labs Reviewed  COMPREHENSIVE METABOLIC PANEL WITH GFR - Abnormal; Notable for the following components:      Result Value   Glucose, Bld 156 (*)    Total Protein 8.3 (*)    Albumin 5.2 (*)    All other components within normal limits  URINALYSIS, ROUTINE W REFLEX MICROSCOPIC - Abnormal; Notable for the following components:   Hgb urine dipstick TRACE (*)    All other components within normal limits  URINALYSIS, MICROSCOPIC (REFLEX) - Abnormal; Notable for the following components:   Bacteria, UA RARE (*)  All other components within normal limits  CBC - Abnormal; Notable for the following components:   WBC 10.8 (*)    All other components within normal limits  DIFFERENTIAL - Abnormal; Notable for the following components:   Neutro Abs 9.4 (*)    All other components within normal limits  LIPASE, BLOOD    EKG: None  Radiology: CT ABDOMEN PELVIS W CONTRAST Result Date: 11/23/2023 CLINICAL DATA:  Upper abdominal pain., nausea, vomiting EXAM: CT ABDOMEN AND PELVIS WITH CONTRAST TECHNIQUE: Multidetector CT imaging of the abdomen and pelvis was performed using the standard protocol following bolus administration of intravenous contrast. RADIATION DOSE REDUCTION: This exam was performed according to the departmental dose-optimization program which includes automated exposure control, adjustment of the mA and/or kV  according to patient size and/or use of iterative reconstruction technique. CONTRAST:  OMNIPAQUE  IOHEXOL  300 MG/ML  SOLN COMPARISON:  Ultrasound today. FINDINGS: Lower chest: Dependent atelectasis.  No effusions. Hepatobiliary: Gallbladder is distended with multiple stones. No evidence of wall thickening, pericholecystic fluid or biliary ductal dilatation. No focal hepatic abnormality. Pancreas: No focal abnormality or ductal dilatation. Spleen: No focal abnormality.  Normal size. Adrenals/Urinary Tract: No adrenal abnormality. No focal renal abnormality. No stones or hydronephrosis. Urinary bladder is unremarkable. Stomach/Bowel: Stomach, large and small bowel grossly unremarkable. No obstruction or inflammatory process. Vascular/Lymphatic: Aortic atherosclerosis. No evidence of aneurysm or adenopathy. Reproductive: Prior hysterectomy.  No adnexal masses. Other: No free fluid or free air. Musculoskeletal: No acute bony abnormality. IMPRESSION: Cholelithiasis.  No CT evidence of acute cholecystitis. Electronically Signed   By: Franky Crease M.D.   On: 11/23/2023 12:39   US  Abdomen Limited RUQ (LIVER/GB) Result Date: 11/23/2023 CLINICAL DATA:  Right upper quadrant pain, gallstones EXAM: ULTRASOUND ABDOMEN LIMITED RIGHT UPPER QUADRANT COMPARISON:  02/24/2022 FINDINGS: Gallbladder: Multiple gallstones measuring up to 1.7 cm. No wall thickening or sonographic Murphy sign. Common bile duct: Diameter: Normal caliber, 4 mm Liver: No focal lesion identified. Within normal limits in parenchymal echogenicity. Portal vein is patent on color Doppler imaging with normal direction of blood flow towards the liver. Other: None. IMPRESSION: Cholelithiasis.  No sonographic evidence of acute cholecystitis. Electronically Signed   By: Franky Crease M.D.   On: 11/23/2023 12:37     Procedures   Medications Ordered in the ED  HYDROmorphone  (DILAUDID ) injection 0.5 mg (0.5 mg Intravenous Given 11/23/23 1051)  lactated ringers   bolus 1,000 mL (0 mLs Intravenous Stopped 11/23/23 1407)  ondansetron  (ZOFRAN ) injection 4 mg (4 mg Intravenous Given 11/23/23 1052)  iohexol  (OMNIPAQUE ) 300 MG/ML solution 100 mL (100 mLs Intravenous Contrast Given 11/23/23 1133)  ondansetron  (ZOFRAN ) injection 4 mg (4 mg Intravenous Given 11/23/23 1322)  HYDROmorphone  (DILAUDID ) injection 0.5 mg (0.5 mg Intravenous Given 11/23/23 1402)  metoCLOPramide  (REGLAN ) tablet 5 mg (5 mg Oral Given 11/23/23 1420)    Clinical Course as of 11/23/23 1450  Mon Nov 23, 2023  1240 US  Abdomen Limited RUQ (LIVER/GB) IMPRESSION: Cholelithiasis.  No sonographic evidence of acute cholecystitis.   Electronically Signed   By: Franky Crease M.D.   On: 11/23/2023 12:37   [TY]  1259 CT ABDOMEN PELVIS W CONTRAST IMPRESSION: Cholelithiasis.  No CT evidence of acute cholecystitis.   Electronically Signed   By: Franky Crease M.D.   On: 11/23/2023 12:39   [TY]  1344 Spoke with general surgery PA on-call, Burnard Banter.  Will admit for observation and possible cholecystectomy tomorrow morning. [TY]  1449 Lab work also reassuring.  Minor leukocytosis, but  no elevation in LFTs or T. Bili.  No overt infectious process identified.  UA without evidence of UTI.  Lipase is normal.  Pancreatitis unlikely.  Patient admitted to surgery team [TY]    Clinical Course User Index [TY] Neysa Caron PARAS, DO                                 Medical Decision Making Is a 63 year old female presents to the emergency department with right upper quadrant abdominal pain.  Per chart review ultrasound in 2023 with cholelithiasis.  She does appear uncomfortable.  Will give IV Dilaudid , Zofran  and IV fluids for symptomatic relief.  Concern for acute cholecystitis/biliary colic.  Will get screening labs, ultrasound and CT scan.  Amount and/or Complexity of Data Reviewed Independent Historian:     Details: Daughter notes history of hysterectomy External Data Reviewed:     Details: See  above Labs: ordered. Decision-making details documented in ED Course. Radiology: ordered and independent interpretation performed. Decision-making details documented in ED Course. ECG/medicine tests: ordered and independent interpretation performed. Decision-making details documented in ED Course.  Risk Prescription drug management. Parenteral controlled substances. Decision regarding hospitalization. Diagnosis or treatment significantly limited by social determinants of health. Risk Details: Poor health literacy       Final diagnoses:  Right upper quadrant abdominal pain    ED Discharge Orders     None          Neysa Caron PARAS, DO 11/23/23 1450

## 2023-11-23 NOTE — ED Triage Notes (Signed)
 Upper abd pain since this am  sharp  pain has had n/v

## 2023-11-23 NOTE — Anesthesia Preprocedure Evaluation (Signed)
 Anesthesia Evaluation  Patient identified by MRN, date of birth, ID band Patient awake    Reviewed: Allergy & Precautions, NPO status , Patient's Chart, lab work & pertinent test results  Airway Mallampati: II  TM Distance: >3 FB Neck ROM: Full    Dental no notable dental hx. (+) Teeth Intact, Dental Advisory Given   Pulmonary former smoker   Pulmonary exam normal breath sounds clear to auscultation       Cardiovascular hypertension, Pt. on medications (-) angina (-) Past MI Normal cardiovascular exam Rhythm:Regular Rate:Normal     Neuro/Psych   Anxiety        GI/Hepatic ,GERD  Medicated and Controlled,,  Endo/Other    Renal/GU Lab Results      Component                Value               Date                           K                        4.0                 11/23/2023               CREATININE               0.72                11/23/2023                GFRNONAA                 >60                 11/23/2023                    ALBUMIN                  5.2 (H)             11/23/2023                GLUCOSE                  156 (H)             11/23/2023                Musculoskeletal   Abdominal   Peds  Hematology Lab Results      Component                Value               Date                      WBC                      10.8 (H)            11/23/2023                HGB                      12.7                11/23/2023  HCT                      38.3                11/23/2023                MCV                      87.6                11/23/2023                PLT                      262                 11/23/2023              Anesthesia Other Findings   Reproductive/Obstetrics                              Anesthesia Physical Anesthesia Plan  ASA: 2  Anesthesia Plan: General   Post-op Pain Management: Toradol  IV (intra-op)*, Tylenol  PO (pre-op)* and Precedex     Induction: Intravenous  PONV Risk Score and Plan: 4 or greater and Treatment may vary due to age or medical condition, Midazolam , Dexamethasone  and Ondansetron   Airway Management Planned: Oral ETT  Additional Equipment: None  Intra-op Plan:   Post-operative Plan: Extubation in OR  Informed Consent:      Dental advisory given  Plan Discussed with: CRNA and Surgeon  Anesthesia Plan Comments:          Anesthesia Quick Evaluation

## 2023-11-24 ENCOUNTER — Ambulatory Visit (HOSPITAL_COMMUNITY): Payer: Self-pay | Admitting: Anesthesiology

## 2023-11-24 ENCOUNTER — Encounter (HOSPITAL_COMMUNITY): Payer: Self-pay

## 2023-11-24 ENCOUNTER — Encounter (HOSPITAL_COMMUNITY): Payer: Self-pay | Admitting: Anesthesiology

## 2023-11-24 ENCOUNTER — Encounter (HOSPITAL_COMMUNITY): Admission: EM | Disposition: A | Discharge status: Home / Self Care | Payer: Self-pay | Source: Home / Self Care

## 2023-11-24 DIAGNOSIS — K8018 Calculus of gallbladder with other cholecystitis without obstruction: Secondary | ICD-10-CM | POA: Diagnosis not present

## 2023-11-24 DIAGNOSIS — K8010 Calculus of gallbladder with chronic cholecystitis without obstruction: Secondary | ICD-10-CM | POA: Diagnosis not present

## 2023-11-24 HISTORY — PX: CHOLECYSTECTOMY: SHX55

## 2023-11-24 LAB — COMPREHENSIVE METABOLIC PANEL WITH GFR
ALT: 33 U/L (ref 0–44)
AST: 50 U/L — ABNORMAL HIGH (ref 15–41)
Albumin: 3.4 g/dL — ABNORMAL LOW (ref 3.5–5.0)
Alkaline Phosphatase: 69 U/L (ref 38–126)
Anion gap: 9 (ref 5–15)
BUN: 9 mg/dL (ref 8–23)
CO2: 25 mmol/L (ref 22–32)
Calcium: 8.7 mg/dL — ABNORMAL LOW (ref 8.9–10.3)
Chloride: 102 mmol/L (ref 98–111)
Creatinine, Ser: 0.96 mg/dL (ref 0.44–1.00)
GFR, Estimated: >60 mL/min (ref >60)
Glucose, Bld: 138 mg/dL — ABNORMAL HIGH (ref 70–99)
Potassium: 3.6 mmol/L (ref 3.5–5.1)
Sodium: 136 mmol/L (ref 135–145)
Total Bilirubin: 0.9 mg/dL (ref 0.0–1.2)
Total Protein: 6.8 g/dL (ref 6.5–8.1)

## 2023-11-24 LAB — CBC
HCT: 35.8 % — ABNORMAL LOW (ref 36.0–46.0)
Hemoglobin: 11.2 g/dL — ABNORMAL LOW (ref 12.0–15.0)
MCH: 28.1 pg (ref 26.0–34.0)
MCHC: 31.3 g/dL (ref 30.0–36.0)
MCV: 89.9 fL (ref 80.0–100.0)
Platelets: 223 K/uL (ref 150–400)
RBC: 3.98 MIL/uL (ref 3.87–5.11)
RDW: 13.7 % (ref 11.5–15.5)
WBC: 11.5 K/uL — ABNORMAL HIGH (ref 4.0–10.5)
nRBC: 0 % (ref 0.0–0.2)

## 2023-11-24 SURGERY — LAPAROSCOPIC CHOLECYSTECTOMY WITH INTRAOPERATIVE CHOLANGIOGRAM
Anesthesia: General

## 2023-11-24 MED ORDER — HYDROMORPHONE HCL 1 MG/ML IJ SOLN: 0.5000 mg | INTRAMUSCULAR | Status: DC | PRN

## 2023-11-24 MED ORDER — MIDAZOLAM HCL 2 MG/2ML IJ SOLN
INTRAMUSCULAR | Status: AC
  Filled 2023-11-24: qty 2

## 2023-11-24 MED ORDER — FENTANYL CITRATE PF 50 MCG/ML IJ SOSY
25.0000 ug | PREFILLED_SYRINGE | Freq: Once | INTRAMUSCULAR | Status: AC
  Administered 2023-11-24: 25 ug via INTRAVENOUS

## 2023-11-24 MED ORDER — BUPIVACAINE-EPINEPHRINE (PF) 0.25% -1:200000 IJ SOLN
INTRAMUSCULAR | Status: AC
  Filled 2023-11-24: qty 30

## 2023-11-24 MED ORDER — SUGAMMADEX SODIUM 200 MG/2ML IV SOLN
INTRAVENOUS | Status: DC | PRN
  Administered 2023-11-24: 200 mg via INTRAVENOUS

## 2023-11-24 MED ORDER — ROCURONIUM 10MG/ML (10ML) SYRINGE FOR MEDFUSION PUMP - OPTIME
INTRAVENOUS | Status: DC | PRN
  Administered 2023-11-24: 50 mg via INTRAVENOUS

## 2023-11-24 MED ORDER — PROPOFOL 10 MG/ML IV BOLUS
INTRAVENOUS | Status: AC
  Filled 2023-11-24: qty 20

## 2023-11-24 MED ORDER — SUGAMMADEX SODIUM 200 MG/2ML IV SOLN
INTRAVENOUS | Status: AC
  Filled 2023-11-24: qty 4

## 2023-11-24 MED ORDER — ROCURONIUM BROMIDE 10 MG/ML (PF) SYRINGE
PREFILLED_SYRINGE | INTRAVENOUS | Status: AC
  Filled 2023-11-24: qty 10

## 2023-11-24 MED ORDER — KETOROLAC TROMETHAMINE 30 MG/ML IJ SOLN
INTRAMUSCULAR | Status: AC
  Filled 2023-11-24: qty 1

## 2023-11-24 MED ORDER — CHLORHEXIDINE GLUCONATE 0.12 % MT SOLN: 15.0000 mL | Freq: Once | OROMUCOSAL | Status: DC

## 2023-11-24 MED ORDER — CARMEX CLASSIC LIP BALM EX OINT
TOPICAL_OINTMENT | CUTANEOUS | Status: AC
Start: 2023-11-24 — End: 2023-11-24
  Filled 2023-11-24: qty 10

## 2023-11-24 MED ORDER — FENTANYL CITRATE (PF) 250 MCG/5ML IJ SOLN
INTRAMUSCULAR | Status: AC
  Filled 2023-11-24: qty 5

## 2023-11-24 MED ORDER — BUPIVACAINE-EPINEPHRINE 0.25% -1:200000 IJ SOLN
INTRAMUSCULAR | Status: DC | PRN
  Administered 2023-11-24: 24 mL

## 2023-11-24 MED ORDER — HYDROMORPHONE HCL 1 MG/ML IJ SOLN: 0.2500 mg | INTRAMUSCULAR | Status: DC | PRN

## 2023-11-24 MED ORDER — LACTATED RINGERS IR SOLN
Status: DC | PRN
Start: 2023-11-24 — End: 2023-11-24
  Administered 2023-11-24: 2000 mL

## 2023-11-24 MED ORDER — FENTANYL CITRATE (PF) 250 MCG/5ML IJ SOLN
INTRAMUSCULAR | Status: DC | PRN
  Administered 2023-11-24: 25 ug via INTRAVENOUS
  Administered 2023-11-24 (×4): 50 ug via INTRAVENOUS
  Administered 2023-11-24: 25 ug via INTRAVENOUS

## 2023-11-24 MED ORDER — AMISULPRIDE (ANTIEMETIC) 5 MG/2ML IV SOLN: 10.0000 mg | Freq: Once | INTRAVENOUS | Status: DC | PRN

## 2023-11-24 MED ORDER — ONDANSETRON HCL 4 MG/2ML IJ SOLN: 4.0000 mg | Freq: Once | INTRAMUSCULAR | Status: DC | PRN

## 2023-11-24 MED ORDER — GUAIFENESIN 100 MG/5ML PO LIQD
10.0000 mL | ORAL | Status: DC | PRN
  Administered 2023-11-24: 10 mL via ORAL
  Filled 2023-11-24: qty 10

## 2023-11-24 MED ORDER — OXYCODONE HCL 5 MG/5ML PO SOLN: 5.0000 mg | Freq: Once | ORAL | Status: DC | PRN

## 2023-11-24 MED ORDER — LACTATED RINGERS IV SOLN: INTRAVENOUS | Status: DC

## 2023-11-24 MED ORDER — DEXMEDETOMIDINE HCL IN NACL 80 MCG/20ML IV SOLN
INTRAVENOUS | Status: DC | PRN
Start: 2023-11-24 — End: 2023-11-24
  Administered 2023-11-24: 8 ug via INTRAVENOUS

## 2023-11-24 MED ORDER — MIDAZOLAM HCL 2 MG/2ML IJ SOLN
INTRAMUSCULAR | Status: DC | PRN
Start: 2023-11-24 — End: 2023-11-24
  Administered 2023-11-24 (×2): 1 mg via INTRAVENOUS

## 2023-11-24 MED ORDER — OXYCODONE HCL 5 MG PO TABS: 5.0000 mg | ORAL_TABLET | Freq: Once | ORAL | Status: DC | PRN

## 2023-11-24 MED ORDER — FENTANYL CITRATE PF 50 MCG/ML IJ SOSY
PREFILLED_SYRINGE | INTRAMUSCULAR | Status: AC
  Filled 2023-11-24: qty 1

## 2023-11-24 MED ORDER — DEXAMETHASONE SODIUM PHOSPHATE 10 MG/ML IJ SOLN
INTRAMUSCULAR | Status: DC | PRN
  Administered 2023-11-24: 10 mg via INTRAVENOUS

## 2023-11-24 MED ORDER — ONDANSETRON HCL 4 MG/2ML IJ SOLN
INTRAMUSCULAR | Status: DC | PRN
  Administered 2023-11-24: 4 mg via INTRAVENOUS

## 2023-11-24 MED ORDER — ACETAMINOPHEN 10 MG/ML IV SOLN: 1000.0000 mg | Freq: Once | INTRAVENOUS | Status: DC | PRN

## 2023-11-24 MED ORDER — PROPOFOL 10 MG/ML IV BOLUS
INTRAVENOUS | Status: DC | PRN
  Administered 2023-11-24: 150 mg via INTRAVENOUS

## 2023-11-24 MED ORDER — LIDOCAINE HCL (PF) 2 % IJ SOLN
INTRAMUSCULAR | Status: AC
  Filled 2023-11-24: qty 5

## 2023-11-24 MED ORDER — LIDOCAINE 2% (20 MG/ML) 5 ML SYRINGE
INTRAMUSCULAR | Status: DC | PRN
  Administered 2023-11-24: 100 mg via INTRAVENOUS

## 2023-11-24 MED ORDER — ENOXAPARIN SODIUM 40 MG/0.4ML IJ SOSY: 40.0000 mg | PREFILLED_SYRINGE | INTRAMUSCULAR | Status: DC

## 2023-11-24 MED ORDER — CEFAZOLIN SODIUM-DEXTROSE 2-3 GM-%(50ML) IV SOLR
INTRAVENOUS | Status: DC | PRN
  Administered 2023-11-24: 2 g via INTRAVENOUS

## 2023-11-24 MED ORDER — PHENYLEPHRINE HCL (PRESSORS) 10 MG/ML IV SOLN
INTRAVENOUS | Status: DC | PRN
  Administered 2023-11-24: 200 ug via INTRAVENOUS

## 2023-11-24 MED ORDER — AMOXICILLIN-POT CLAVULANATE 875-125 MG PO TABS
1.0000 | ORAL_TABLET | Freq: Two times a day (BID) | ORAL | Status: DC
  Administered 2023-11-24 – 2023-11-25 (×2): 1 via ORAL
  Filled 2023-11-24 (×2): qty 1

## 2023-11-24 MED ORDER — KETOROLAC TROMETHAMINE 30 MG/ML IJ SOLN
INTRAMUSCULAR | Status: DC | PRN
Start: 2023-11-24 — End: 2023-11-24
  Administered 2023-11-24: 30 mg via INTRAVENOUS

## 2023-11-24 SURGICAL SUPPLY — 37 items
BAG COUNTER SPONGE SURGICOUNT (BAG) IMPLANT
CABLE HIGH FREQUENCY MONO STRZ (ELECTRODE) ×1 IMPLANT
CATH REDDICK CHOLANGI 4FR 50CM (CATHETERS) ×1 IMPLANT
CHLORAPREP W/TINT 26 (MISCELLANEOUS) ×1 IMPLANT
CLIP APPLIE 5 13 M/L LIGAMAX5 (MISCELLANEOUS) ×1 IMPLANT
COVER MAYO STAND XLG (MISCELLANEOUS) ×1 IMPLANT
CUTTER FLEX LINEAR 45M (STAPLE) IMPLANT
DERMABOND ADVANCED .7 DNX12 (GAUZE/BANDAGES/DRESSINGS) ×1 IMPLANT
DRAIN CHANNEL 19F RND (DRAIN) IMPLANT
DRAPE C-ARM 42X120 X-RAY (DRAPES) ×1 IMPLANT
ELECT REM PT RETURN 15FT ADLT (MISCELLANEOUS) ×1 IMPLANT
EVACUATOR SILICONE 100CC (DRAIN) IMPLANT
GLOVE BIO SURGEON STRL SZ7.5 (GLOVE) ×1 IMPLANT
GOWN STRL REUS W/ TWL LRG LVL3 (GOWN DISPOSABLE) IMPLANT
GRASPER SUT TROCAR 14GX15 (MISCELLANEOUS) IMPLANT
HEMOSTAT ARISTA ABSORB 3G PWDR (HEMOSTASIS) IMPLANT
HEMOSTAT SNOW SURGICEL 2X4 (HEMOSTASIS) IMPLANT
IRRIGATION SUCT STRKRFLW 2 WTP (MISCELLANEOUS) ×1 IMPLANT
IV CATH 14GX2 1/4 (CATHETERS) ×1 IMPLANT
KIT BASIN OR (CUSTOM PROCEDURE TRAY) ×1 IMPLANT
KIT TURNOVER KIT A (KITS) ×1 IMPLANT
PENCIL SMOKE EVACUATOR (MISCELLANEOUS) IMPLANT
RELOAD STAPLE 45 GRN THCK ETS (ENDOMECHANICALS) IMPLANT
SCISSORS LAP 5X35 DISP (ENDOMECHANICALS) ×1 IMPLANT
SET TUBE SMOKE EVAC HIGH FLOW (TUBING) ×1 IMPLANT
SHEARS HARMONIC 45 ACE (MISCELLANEOUS) IMPLANT
SLEEVE Z-THREAD 5X100MM (TROCAR) ×2 IMPLANT
SPIKE FLUID TRANSFER (MISCELLANEOUS) ×1 IMPLANT
SUT ETHILON 2 0 PS N (SUTURE) IMPLANT
SUT MNCRL AB 4-0 PS2 18 (SUTURE) ×1 IMPLANT
SUT VICRYL 0 UR6 27IN ABS (SUTURE) IMPLANT
SYSTEM BAG RETRIEVAL 10MM (BASKET) ×1 IMPLANT
TOWEL OR 17X26 10 PK STRL BLUE (TOWEL DISPOSABLE) ×1 IMPLANT
TRAY LAPAROSCOPIC (CUSTOM PROCEDURE TRAY) ×1 IMPLANT
TROCAR BALLN 12MMX100 BLUNT (TROCAR) ×1 IMPLANT
TROCAR Z THREAD OPTICAL 12X100 (TROCAR) IMPLANT
TROCAR Z-THREAD OPTICAL 5X100M (TROCAR) ×1 IMPLANT

## 2023-11-24 NOTE — Op Note (Signed)
 11/23/2023 - 11/24/2023  1:31 PM  PATIENT:  Gina Velez  63 y.o. female  PRE-OPERATIVE DIAGNOSIS:  CHOLELITHIASIS  POST-OPERATIVE DIAGNOSIS:  CHOLELITHIASIS WITH CHOLECYSTITIS  PROCEDURE:  Procedure(s): LAPAROSCOPIC CHOLECYSTECTOMY (N/A)  SURGEON:  Surgeons and Role:    * Curvin Deward MOULD, MD - Primary  PHYSICIAN ASSISTANT:   ASSISTANTS: Burnard Banter, PA   ANESTHESIA:   local and general  EBL:  30 mL   BLOOD ADMINISTERED:none  DRAINS: (1) Blake drain(s) in the gallbladder bed of liver   LOCAL MEDICATIONS USED:  MARCAINE      SPECIMEN:  Source of Specimen:  gallbladder  DISPOSITION OF SPECIMEN:  PATHOLOGY  COUNTS:  YES  TOURNIQUET:  * No tourniquets in log *  DICTATION: .Dragon Dictation    Procedure: After informed consent was obtained the patient was brought to the operating room and placed in the supine position on the operating room table. After adequate induction of general anesthesia the patient's abdomen was prepped with ChloraPrep allowed to dry and draped in usual sterile manner. An appropriate timeout was performed. The area above the umbilicus was infiltrated with quarter percent  Marcaine . A small incision was made with a 15 blade knife. The incision was carried down through the subcutaneous tissue bluntly with a hemostat and [employer] retractors. The linea alba was identified. The linea alba was incised with a 15 blade knife and each side was grasped with Coker clamps. The preperitoneal space was then probed with a hemostat until the peritoneum was opened and access was gained to the abdominal cavity. A 0 Vicryl pursestring stitch was placed in the fascia surrounding the opening. A Hassan cannula was then placed through the opening and anchored in place with the previously placed Vicryl purse string stitch. The abdomen was insufflated with carbon dioxide without difficulty. A laparoscope was inserted through the [facility] cannula in the right upper quadrant was  inspected. Next the epigastric region was infiltrated with % Marcaine . A small incision was made with a 15 blade knife. A 12 mm port was placed bluntly through this incision into the abdominal cavity under direct vision. Next 2 sites were chosen laterally on the right side of the abdomen for placement of 5 mm ports. Each of these areas was infiltrated with quarter percent Marcaine . Small stab incisions were made with a 15 blade knife. 5 mm ports were then placed bluntly through these incisions into the abdominal cavity under direct vision without difficulty. A blunt grasper was placed through the lateralmost 5 mm port and used to grasp the dome of the gallbladder and elevate it anteriorly and superiorly.  The gallbladder was severely inflamed with a posterior wall that was obviously necrotic.  The gallbladder had to be aspirated in order to grasp the wall.  Another blunt grasper was placed through the other 5 mm port and used to retract the body and neck of the gallbladder.  The base of the gallbladder where it joined the cystic duct was too inflamed to dissected out safely.  I elected to remove the gallbladder from the liver bed using a top-down technique with the harmonic scalpel.  During this dissection the gallbladder was entered.  Many large stones were removed.  Once the dissection reached the bottom of the gallbladder where it started to narrow to join the cystic duct I then divided the gallbladder at this point with 2 firings of a laparoscopic GIA green load stapler.  A laparoscopic bag was inserted through the epigastric port. The gallbladder was placed  within the bag and the bag was sealed.  The abdomen was then irrigated until all the stone debris was evacuated.  The area looked very hemostatic once the gallbladder was detached.  I did coat the gallbladder bed of the liver with Arista to help with hemostasis.  Next a 57 [place] round Blake drain was brought through the epigastric port and out the  lateralmost 5 mm port.  The drain was placed in the gallbladder bed of the liver.  A couple of small tears of the liver were also noted near the falciform ligament and these were cauterized until hemostatic.  No other abnormalities were noted on general inspection of the abdomen.  The bag with the gallbladder was then removed with the [facility] cannula through the infraumbilical port without difficulty. The fascial defect was then closed with the previously placed Vicryl pursestring stitch as well as with another figure-of-eight 0 Vicryl stitch. The ports were then removed under direct vision without difficulty and were found to be hemostatic. The gas was allowed to escape. No other abnormalities were noted on general inspection of the abdomen. The skin incisions were all closed with interrupted 4-0 Monocryl subcuticular stitches. Dermabond dressings were applied. The patient tolerated the procedure well. At the end of the case all needle sponge and instrument counts were correct. The patient was then awakened and taken to recovery in stable condition.  The assistant was instrumental for visualization and completion of the case.  PLAN OF CARE: Admit to inpatient   PATIENT DISPOSITION:  PACU - hemodynamically stable.   Delay start of Pharmacological VTE agent (>24hrs) due to surgical blood loss or risk of bleeding: no

## 2023-11-24 NOTE — Progress Notes (Signed)
 At bedside for PIV insertion. Attempted x 1 with no success on right extremity. Very poor vasculature, Vessels noted to be over 1.5 cm in depth, Brachial vessel attempted with vessels splitting and unable to fully cannulate. No cephalic vessel identified. Left upper arm attempted with no suitable vessels noted. Cephalic non-compressible. Attempted midline placement in left basilic, however, the vessel completely collapsed. Brachial vessel > 1.5 cm in depth. Primary RN aware and to notify provider.

## 2023-11-24 NOTE — Transfer of Care (Signed)
 Immediate Anesthesia Transfer of Care Note  Patient: Gina Velez  Procedure(s) Performed: LAPAROSCOPIC CHOLECYSTECTOMY  Patient Location: PACU  Anesthesia Type:General  Level of Consciousness: drowsy and patient cooperative  Airway & Oxygen Therapy: Patient Spontanous Breathing and Patient connected to nasal cannula oxygen  Post-op Assessment: Report given to RN and Post -op Vital signs reviewed and stable  Post vital signs: Reviewed and stable  Last Vitals:  Vitals Value Taken Time  BP 122/71 11/24/23 13:50  Temp    Pulse 61 11/24/23 13:51  Resp 14 11/24/23 13:51  SpO2 86 % 11/24/23 13:51  Vitals shown include unfiled device data.  Last Pain:  Vitals:   11/24/23 1027  TempSrc:   PainSc: 8       Patients Stated Pain Goal: 0 (11/23/23 1533)  Complications: No notable events documented.

## 2023-11-24 NOTE — Discharge Instructions (Signed)

## 2023-11-24 NOTE — Plan of Care (Signed)

## 2023-11-24 NOTE — Interval H&P Note (Signed)
 History and Physical Interval Note:  11/24/2023 8:16 AM  Gina Velez  has presented today for surgery, with the diagnosis of CHOLELITHIASIS.  The various methods of treatment have been discussed with the patient and family. After consideration of risks, benefits and other options for treatment, the patient has consented to  Procedure(s): LAPAROSCOPIC CHOLECYSTECTOMY WITH INTRAOPERATIVE CHOLANGIOGRAM (N/A) as a surgical intervention.  The patient's history has been reviewed, patient examined, no change in status, stable for surgery.  I have reviewed the patient's chart and labs.  Questions were answered to the patient's satisfaction.     Deward Null III

## 2023-11-24 NOTE — Anesthesia Procedure Notes (Signed)
 Procedure Name: Intubation Date/Time: 11/24/2023 11:53 AM  Performed by: Nanci Riis, CRNAPre-anesthesia Checklist: Patient identified, Emergency Drugs available, Suction available and Patient being monitored Patient Re-evaluated:Patient Re-evaluated prior to induction Oxygen Delivery Method: Circle system utilized Preoxygenation: Pre-oxygenation with 100% oxygen Induction Type: IV induction Ventilation: Mask ventilation without difficulty Laryngoscope Size: Mac and 3 Grade View: Grade I Tube type: Oral Tube size: 7.0 mm Number of attempts: 1 Airway Equipment and Method: Stylet Placement Confirmation: ETT inserted through vocal cords under direct vision, positive ETCO2 and breath sounds checked- equal and bilateral Secured at: 22 cm Tube secured with: Tape Dental Injury: Teeth and Oropharynx as per pre-operative assessment

## 2023-11-25 ENCOUNTER — Encounter (HOSPITAL_COMMUNITY): Payer: Self-pay | Admitting: General Surgery

## 2023-11-25 LAB — COMPREHENSIVE METABOLIC PANEL WITH GFR
ALT: 100 U/L — ABNORMAL HIGH (ref 0–44)
AST: 77 U/L — ABNORMAL HIGH (ref 15–41)
Albumin: 4 g/dL (ref 3.5–5.0)
Alkaline Phosphatase: 104 U/L (ref 38–126)
Anion gap: 14 (ref 5–15)
BUN: 11 mg/dL (ref 8–23)
CO2: 23 mmol/L (ref 22–32)
Calcium: 9.4 mg/dL (ref 8.9–10.3)
Chloride: 103 mmol/L (ref 98–111)
Creatinine, Ser: 1.01 mg/dL — ABNORMAL HIGH (ref 0.44–1.00)
GFR, Estimated: >60 mL/min (ref >60)
Glucose, Bld: 121 mg/dL — ABNORMAL HIGH (ref 70–99)
Potassium: 3.7 mmol/L (ref 3.5–5.1)
Sodium: 139 mmol/L (ref 135–145)
Total Bilirubin: 0.5 mg/dL (ref 0.0–1.2)
Total Protein: 7.3 g/dL (ref 6.5–8.1)

## 2023-11-25 LAB — CBC
HCT: 36.3 % (ref 36.0–46.0)
Hemoglobin: 11.5 g/dL — ABNORMAL LOW (ref 12.0–15.0)
MCH: 29 pg (ref 26.0–34.0)
MCHC: 31.7 g/dL (ref 30.0–36.0)
MCV: 91.4 fL (ref 80.0–100.0)
Platelets: 229 K/uL (ref 150–400)
RBC: 3.97 MIL/uL (ref 3.87–5.11)
RDW: 13.9 % (ref 11.5–15.5)
WBC: 14.4 K/uL — ABNORMAL HIGH (ref 4.0–10.5)
nRBC: 0 % (ref 0.0–0.2)

## 2023-11-25 LAB — SURGICAL PATHOLOGY

## 2023-11-25 MED ORDER — OXYCODONE HCL 5 MG PO TABS: 5.0000 mg | ORAL_TABLET | ORAL | 0 refills | Status: AC | PRN

## 2023-11-25 MED ORDER — ACETAMINOPHEN 500 MG PO TABS
1000.0000 mg | ORAL_TABLET | Freq: Four times a day (QID) | ORAL | Status: AC | PRN
Start: 2023-11-25 — End: ?

## 2023-11-25 MED ORDER — AMOXICILLIN-POT CLAVULANATE 875-125 MG PO TABS: 1.0000 | ORAL_TABLET | Freq: Two times a day (BID) | ORAL | 0 refills | Status: AC

## 2023-11-25 NOTE — Anesthesia Postprocedure Evaluation (Signed)
 Anesthesia Post Note  Patient: Gina Velez  Procedure(s) Performed: LAPAROSCOPIC CHOLECYSTECTOMY     Patient location during evaluation: PACU Anesthesia Type: General Level of consciousness: awake and alert Pain management: pain level controlled Vital Signs Assessment: post-procedure vital signs reviewed and stable Respiratory status: spontaneous breathing, nonlabored ventilation, respiratory function stable and patient connected to nasal cannula oxygen Cardiovascular status: blood pressure returned to baseline and stable Postop Assessment: no apparent nausea or vomiting Anesthetic complications: no   No notable events documented.  Last Vitals:  Vitals:   11/25/23 0149 11/25/23 0517  BP: 126/60 111/71  Pulse: 68 61  Resp: 18 18  Temp: 37.2 C 36.8 C  SpO2: 97% 97%    Last Pain:  Vitals:   11/25/23 0517  TempSrc: Oral  PainSc:                  Garnette DELENA Gab

## 2023-11-25 NOTE — Progress Notes (Signed)
 Reviewed d/c instructions with pt. All questions answered. Pt taken to front entrance via wheelchair to meet ride.

## 2023-11-25 NOTE — Progress Notes (Signed)
   11/25/23 0851  TOC Brief Assessment  Insurance and Status Reviewed  Patient has primary care physician Yes  Home environment has been reviewed home with adult daughter  Prior level of function: independent  Prior/Current Home Services No current home services  Social Drivers of Health Review SDOH reviewed no interventions necessary  Readmission risk has been reviewed Yes  Transition of care needs no transition of care needs at this time

## 2023-11-25 NOTE — Progress Notes (Signed)
 1 Day Post-Op   Subjective/Chief Complaint: Complains of soreness but feels better   Objective: Vital signs in last 24 hours: Temp:  [97.9 F (36.6 C)-99.4 F (37.4 C)] 98.2 F (36.8 C) (08/27 0517) Pulse Rate:  [59-78] 61 (08/27 0517) Resp:  [12-20] 18 (08/27 0517) BP: (111-143)/(50-71) 111/71 (08/27 0517) SpO2:  [87 %-100 %] 97 % (08/27 0517) Weight:  [76.2 kg] 76.2 kg (08/26 1012) Last BM Date : 11/23/23  Intake/Output from previous day: 08/26 0701 - 08/27 0700 In: 2920.9 [P.O.:960; I.V.:1960.9] Out: 1195 [Urine:950; Drains:215; Blood:30] Intake/Output this shift: No intake/output data recorded.  General appearance: alert and cooperative Resp: clear to auscultation bilaterally Cardio: regular rate and rhythm GI: soft, mild tenderness. Drain output serosanguinous  Lab Results:  Recent Labs    11/23/23 1300 11/24/23 0448  WBC 10.8* 11.5*  HGB 12.7 11.2*  HCT 38.3 35.8*  PLT 262 223   BMET Recent Labs    11/23/23 1025 11/24/23 0448  NA 139 136  K 4.0 3.6  CL 102 102  CO2 23 25  GLUCOSE 156* 138*  BUN 8 9  CREATININE 0.72 0.96  CALCIUM 9.7 8.7*   PT/INR No results for input(s): LABPROT, INR in the last 72 hours. ABG No results for input(s): PHART, HCO3 in the last 72 hours.  Invalid input(s): PCO2, PO2  Studies/Results: CT ABDOMEN PELVIS W CONTRAST Result Date: 11/23/2023 CLINICAL DATA:  Upper abdominal pain., nausea, vomiting EXAM: CT ABDOMEN AND PELVIS WITH CONTRAST TECHNIQUE: Multidetector CT imaging of the abdomen and pelvis was performed using the standard protocol following bolus administration of intravenous contrast. RADIATION DOSE REDUCTION: This exam was performed according to the departmental dose-optimization program which includes automated exposure control, adjustment of the mA and/or kV according to patient size and/or use of iterative reconstruction technique. CONTRAST:  OMNIPAQUE  IOHEXOL  300 MG/ML  SOLN COMPARISON:   Ultrasound today. FINDINGS: Lower chest: Dependent atelectasis.  No effusions. Hepatobiliary: Gallbladder is distended with multiple stones. No evidence of wall thickening, pericholecystic fluid or biliary ductal dilatation. No focal hepatic abnormality. Pancreas: No focal abnormality or ductal dilatation. Spleen: No focal abnormality.  Normal size. Adrenals/Urinary Tract: No adrenal abnormality. No focal renal abnormality. No stones or hydronephrosis. Urinary bladder is unremarkable. Stomach/Bowel: Stomach, large and small bowel grossly unremarkable. No obstruction or inflammatory process. Vascular/Lymphatic: Aortic atherosclerosis. No evidence of aneurysm or adenopathy. Reproductive: Prior hysterectomy.  No adnexal masses. Other: No free fluid or free air. Musculoskeletal: No acute bony abnormality. IMPRESSION: Cholelithiasis.  No CT evidence of acute cholecystitis. Electronically Signed   By: Franky Crease M.D.   On: 11/23/2023 12:39   US  Abdomen Limited RUQ (LIVER/GB) Result Date: 11/23/2023 CLINICAL DATA:  Right upper quadrant pain, gallstones EXAM: ULTRASOUND ABDOMEN LIMITED RIGHT UPPER QUADRANT COMPARISON:  02/24/2022 FINDINGS: Gallbladder: Multiple gallstones measuring up to 1.7 cm. No wall thickening or sonographic Murphy sign. Common bile duct: Diameter: Normal caliber, 4 mm Liver: No focal lesion identified. Within normal limits in parenchymal echogenicity. Portal vein is patent on color Doppler imaging with normal direction of blood flow towards the liver. Other: None. IMPRESSION: Cholelithiasis.  No sonographic evidence of acute cholecystitis. Electronically Signed   By: Franky Crease M.D.   On: 11/23/2023 12:37    Anti-infectives: Anti-infectives (From admission, onward)    Start     Dose/Rate Route Frequency Ordered Stop   11/25/23 0000  amoxicillin -clavulanate (AUGMENTIN ) 875-125 MG tablet        1 tablet Oral Every 12 hours 11/25/23 0758  11/28/23 2359   11/24/23 2200   amoxicillin -clavulanate (AUGMENTIN ) 875-125 MG per tablet 1 tablet        1 tablet Oral Every 12 hours 11/24/23 1617     11/24/23 1100  cefTRIAXone  (ROCEPHIN ) 2 g in sodium chloride  0.9 % 100 mL IVPB  Status:  Discontinued        2 g 200 mL/hr over 30 Minutes Intravenous On call to O.R. 11/23/23 1501 11/23/23 1547   11/23/23 1700  cefTRIAXone  (ROCEPHIN ) 2 g in sodium chloride  0.9 % 100 mL IVPB  Status:  Discontinued        2 g 200 mL/hr over 30 Minutes Intravenous Every 24 hours 11/23/23 1547 11/24/23 1617       Assessment/Plan: s/p Procedure(s): LAPAROSCOPIC CHOLECYSTECTOMY (N/A) Advance diet Discharge Teach pt drain care Switch to oral abx  LOS: 0 days    Deward Null III 11/25/2023

## 2023-11-26 NOTE — Discharge Summary (Signed)
 Patient ID: Gina Velez [id number] Nov 09, 1960 63 y.o.  Admit date: 11/23/2023 Discharge date: 11/25/2023  Admitting Diagnosis: Biliary colic  Discharge Diagnosis Patient Active Problem List   Diagnosis Date Noted   Biliary colic 11/23/2023  Gangrenous cholecystitis, s/p lap chole with JP drain placement  Consultants none  Reason for Admission: Patient is a 63 year old female who presented to [facility] ED with epigastric and RUQ abdominal pain that started around 2 AM. Pain was acute onset and woke her from sleep. Pain has been constant overall but waxing/waning in intensity. Known hx of gallstones and intermittent episodes for 10 yrs.  She has known she needed her gallbladder out but was scared to have surgery. She did have associated nausea and vomiting. PMH otherwise significant for HLD, GERD, Anxiety, Insomnia  and Chronic joint pain. Prior abdominal surgery includes partial abdominal hysterectomy. Not on blood thinners and NKDA.    In the ED, she has been found to have normal LFTs and a WBC of 10K.  Her US  shows stones, but no wall thickening or signs of cholecystitis.  She has continued to have pain despite medications though.  We have been asked to evaluate her for further recommendations.  Procedures Lap chole with JP drain placement, Dr. Curvin 11/24/23  Hospital Course:  The patient was admitted and underwent a laparoscopic cholecystectomy with JP drain placement for gangrenous cholecystitis.  The patient tolerated the procedure well.  On POD 1, the patient was tolerating a regular diet, voiding well, mobilizing, and pain was controlled with oral pain medications.  Her drain was serosanguineous in nature and will DC home with her drain.  The patient was stable for DC home at this time with appropriate follow up made.   Allergies as of 11/25/2023   No Known Allergies      Medication List     TAKE these medications    acetaminophen  500 MG tablet Commonly known as:  TYLENOL  Take 2 tablets (1,000 mg total) by mouth every 6 (six) hours as needed.   amoxicillin -clavulanate 875-125 MG tablet Commonly known as: AUGMENTIN  Take 1 tablet by mouth every 12 (twelve) hours for 3 days.   ASHWAGANDHA PO Take 2 tablets by mouth daily.   fluticasone 50 MCG/ACT nasal spray Commonly known as: FLONASE Place 1 spray into both nostrils as needed for allergies or rhinitis.   MAGNESIUM PO Take 1-2 tablets by mouth as needed.   MELATONIN PO Take 2 tablets by mouth as needed.   montelukast  10 MG tablet Commonly known as: SINGULAIR  Take 10 mg by mouth as needed.   multivitamin tablet Take 1 tablet by mouth daily.   oxyCODONE  5 MG immediate release tablet Commonly known as: Oxy IR/ROXICODONE  Take 1 tablet (5 mg total) by mouth every 4 (four) hours as needed.   UNABLE TO FIND Take 1 capsule by mouth as needed. CBD capsules          Follow-up Information     Maczis, Puja Gosai, PA-C Follow up on 12/17/2023.   Specialty: General Surgery Why: 2:00pm, Arrive 15 minutes prior to your appointment time, Please bring your insurance card and photo ID Contact information: [address] [place] [address] [facility] [place] [address] [phone]         Curvin Mt III, MD Follow up on 12/04/2023.   Specialty: General Surgery Why: 2:40pm, Arrive 30 minutes prior to your appointment time, Please bring your insurance card and photo ID.  This is for removal  of your drain Contact information: [address] [place] [address] [place] [address] [phone]                 Signed: Burnard Banter, [facility] 11/26/2023, 7:04 AM Please see Amion for pager number during day hours 7:00am-4:30pm, 7-11:30am on Weekends

## 2024-01-26 DIAGNOSIS — Z13220 Encounter for screening for lipoid disorders: Secondary | ICD-10-CM | POA: Diagnosis not present

## 2024-01-26 DIAGNOSIS — Z0000 Encounter for general adult medical examination without abnormal findings: Secondary | ICD-10-CM | POA: Diagnosis not present

## 2024-01-26 DIAGNOSIS — Z01411 Encounter for gynecological examination (general) (routine) with abnormal findings: Secondary | ICD-10-CM | POA: Diagnosis not present

## 2024-01-26 DIAGNOSIS — Z01419 Encounter for gynecological examination (general) (routine) without abnormal findings: Secondary | ICD-10-CM | POA: Diagnosis not present

## 2024-01-28 ENCOUNTER — Other Ambulatory Visit: Payer: Self-pay | Admitting: Family Medicine

## 2024-01-28 DIAGNOSIS — Z1231 Encounter for screening mammogram for malignant neoplasm of breast: Secondary | ICD-10-CM

## 2024-02-17 ENCOUNTER — Ambulatory Visit
Admission: RE | Admit: 2024-02-17 | Discharge: 2024-02-17 | Disposition: A | Discharge status: Home / Self Care | Source: Ambulatory Visit | Attending: Family Medicine | Admitting: Family Medicine

## 2024-02-17 DIAGNOSIS — Z1231 Encounter for screening mammogram for malignant neoplasm of breast: Secondary | ICD-10-CM | POA: Diagnosis not present
